# Patient Record
Sex: Male | Born: 1950 | Race: Black or African American | Hispanic: No | Marital: Single | Smoking: Never smoker
Health system: Southern US, Community
[De-identification: ages and names within clinical notes are randomized; demographics above are authoritative.]

## PROBLEM LIST (undated history)

## (undated) DIAGNOSIS — I10 Essential (primary) hypertension: Secondary | ICD-10-CM

## (undated) DIAGNOSIS — F1920 Other psychoactive substance dependence, uncomplicated: Secondary | ICD-10-CM

## (undated) DIAGNOSIS — M109 Gout, unspecified: Secondary | ICD-10-CM

## (undated) HISTORY — PX: BACK SURGERY: SHX140

---

## 2014-08-17 ENCOUNTER — Emergency Department (HOSPITAL_COMMUNITY): Payer: No Typology Code available for payment source

## 2014-08-17 ENCOUNTER — Emergency Department (HOSPITAL_COMMUNITY)
Admission: EM | Admit: 2014-08-17 | Discharge: 2014-08-17 | Disposition: A | Discharge status: Home / Self Care | Payer: No Typology Code available for payment source | Attending: Emergency Medicine | Admitting: Emergency Medicine

## 2014-08-17 ENCOUNTER — Encounter (HOSPITAL_COMMUNITY): Payer: Self-pay | Admitting: Emergency Medicine

## 2014-08-17 DIAGNOSIS — I10 Essential (primary) hypertension: Secondary | ICD-10-CM | POA: Diagnosis not present

## 2014-08-17 DIAGNOSIS — S161XXA Strain of muscle, fascia and tendon at neck level, initial encounter: Secondary | ICD-10-CM | POA: Diagnosis not present

## 2014-08-17 DIAGNOSIS — Y998 Other external cause status: Secondary | ICD-10-CM | POA: Insufficient documentation

## 2014-08-17 DIAGNOSIS — Z8739 Personal history of other diseases of the musculoskeletal system and connective tissue: Secondary | ICD-10-CM | POA: Diagnosis not present

## 2014-08-17 DIAGNOSIS — S3992XA Unspecified injury of lower back, initial encounter: Secondary | ICD-10-CM | POA: Insufficient documentation

## 2014-08-17 DIAGNOSIS — Y92410 Unspecified street and highway as the place of occurrence of the external cause: Secondary | ICD-10-CM | POA: Diagnosis not present

## 2014-08-17 DIAGNOSIS — Y9389 Activity, other specified: Secondary | ICD-10-CM | POA: Insufficient documentation

## 2014-08-17 DIAGNOSIS — S199XXA Unspecified injury of neck, initial encounter: Secondary | ICD-10-CM | POA: Diagnosis present

## 2014-08-17 HISTORY — DX: Gout, unspecified: M10.9

## 2014-08-17 HISTORY — DX: Essential (primary) hypertension: I10

## 2014-08-17 MED ORDER — OXYCODONE-ACETAMINOPHEN 5-325 MG PO TABS
1.0000 | ORAL_TABLET | Freq: Once | ORAL | Status: AC
  Administered 2014-08-17: 1 via ORAL
  Filled 2014-08-17: qty 1

## 2014-08-17 MED ORDER — OXYCODONE-ACETAMINOPHEN 5-325 MG PO TABS: ORAL_TABLET | ORAL | Status: DC

## 2014-08-17 NOTE — Discharge Instructions (Signed)
For pain control please take Ibuprofen (also known as Motrin or Advil) 400mg  (this is normally 2 over the counter pills) every 6 hours. Take with food to minimize stomach irritation.  Take percocet for breakthrough pain, do not drink alcohol, drive, care for children or do other critical tasks while taking percocet.  Do not hesitate to return to the emergency room for any new, worsening or concerning symptoms.  Please obtain primary care using resource guide below. But the minute you were seen in the emergency room and that they will need to obtain records for further outpatient management.   Cervical Sprain A cervical sprain is an injury in the neck in which the strong, fibrous tissues (ligaments) that connect your neck bones stretch or tear. Cervical sprains can range from mild to severe. Severe cervical sprains can cause the neck vertebrae to be unstable. This can lead to damage of the spinal cord and can result in serious nervous system problems. The amount of time it takes for a cervical sprain to get better depends on the cause and extent of the injury. Most cervical sprains heal in 1 to 3 weeks. CAUSES  Severe cervical sprains may be caused by:   Contact sport injuries (such as from football, rugby, wrestling, hockey, auto racing, gymnastics, diving, martial arts, or boxing).   Motor vehicle collisions.   Whiplash injuries. This is an injury from a sudden forward and backward whipping movement of the head and neck.  Falls.  Mild cervical sprains may be caused by:   Being in an awkward position, such as while cradling a telephone between your ear and shoulder.   Sitting in a chair that does not offer proper support.   Working at a poorly Marketing executivedesigned computer station.   Looking up or down for long periods of time.  SYMPTOMS   Pain, soreness, stiffness, or a burning sensation in the front, back, or sides of the neck. This discomfort may develop immediately after the injury or  slowly, 24 hours or more after the injury.   Pain or tenderness directly in the middle of the back of the neck.   Shoulder or upper back pain.   Limited ability to move the neck.   Headache.   Dizziness.   Weakness, numbness, or tingling in the hands or arms.   Muscle spasms.   Difficulty swallowing or chewing.   Tenderness and swelling of the neck.  DIAGNOSIS  Most of the time your health care provider can diagnose a cervical sprain by taking your history and doing a physical exam. Your health care provider will ask about previous neck injuries and any known neck problems, such as arthritis in the neck. X-rays may be taken to find out if there are any other problems, such as with the bones of the neck. Other tests, such as a CT scan or MRI, may also be needed.  TREATMENT  Treatment depends on the severity of the cervical sprain. Mild sprains can be treated with rest, keeping the neck in place (immobilization), and pain medicines. Severe cervical sprains are immediately immobilized. Further treatment is done to help with pain, muscle spasms, and other symptoms and may include:  Medicines, such as pain relievers, numbing medicines, or muscle relaxants.   Physical therapy. This may involve stretching exercises, strengthening exercises, and posture training. Exercises and improved posture can help stabilize the neck, strengthen muscles, and help stop symptoms from returning.  HOME CARE INSTRUCTIONS   Put ice on the injured area.  Put ice in a plastic bag.   Place a towel between your skin and the bag.   Leave the ice on for 15-20 minutes, 3-4 times a day.   If your injury was severe, you may have been given a cervical collar to wear. A cervical collar is a two-piece collar designed to keep your neck from moving while it heals.  Do not remove the collar unless instructed by your health care provider.  If you have long hair, keep it outside of the collar.  Ask  your health care provider before making any adjustments to your collar. Minor adjustments may be required over time to improve comfort and reduce pressure on your chin or on the back of your head.  Ifyou are allowed to remove the collar for cleaning or bathing, follow your health care provider's instructions on how to do so safely.  Keep your collar clean by wiping it with mild soap and water and drying it completely. If the collar you have been given includes removable pads, remove them every 1-2 days and hand wash them with soap and water. Allow them to air dry. They should be completely dry before you wear them in the collar.  If you are allowed to remove the collar for cleaning and bathing, wash and dry the skin of your neck. Check your skin for irritation or sores. If you see any, tell your health care provider.  Do not drive while wearing the collar.   Only take over-the-counter or prescription medicines for pain, discomfort, or fever as directed by your health care provider.   Keep all follow-up appointments as directed by your health care provider.   Keep all physical therapy appointments as directed by your health care provider.   Make any needed adjustments to your workstation to promote good posture.   Avoid positions and activities that make your symptoms worse.   Warm up and stretch before being active to help prevent problems.  SEEK MEDICAL CARE IF:   Your pain is not controlled with medicine.   You are unable to decrease your pain medicine over time as planned.   Your activity level is not improving as expected.  SEEK IMMEDIATE MEDICAL CARE IF:   You develop any bleeding.  You develop stomach upset.  You have signs of an allergic reaction to your medicine.   Your symptoms get worse.   You develop new, unexplained symptoms.   You have numbness, tingling, weakness, or paralysis in any part of your body.  MAKE SURE YOU:   Understand these  instructions.  Will watch your condition.  Will get help right away if you are not doing well or get worse. Document Released: 02/22/2007 Document Revised: 05/02/2013 Document Reviewed: 11/02/2012 [facility] Patient Information 2015 [place], [place]. This information is not intended to replace advice given to you by your health care provider. Make sure you discuss any questions you have with your health care provider.    Emergency Department Resource Guide 1) Find a Doctor and Pay Out of Pocket Although you won't have to find out who is covered by your insurance plan, it is a good idea to ask around and get recommendations. You will then need to call the office and see if the doctor you have chosen will accept you as a new patient and what types of options they offer for patients who are self-pay. Some doctors offer discounts or will set up payment plans for their patients who do not have insurance, but you  will need to ask so you aren't surprised when you get to your appointment.  2) Contact Your Local Health Department Not all health departments have doctors that can see patients for sick visits, but many do, so it is worth a call to see if yours does. If you don't know where your local health department is, you can check in your phone book. The CDC also has a tool to help you locate your state's health department, and many state websites also have listings of all of their local health departments.  3) Find a Walk-in Clinic If your illness is not likely to be very severe or complicated, you may want to try a walk in clinic. These are popping up all over the country in pharmacies, drugstores, and shopping centers. They're usually staffed by nurse practitioners or physician assistants that have been trained to treat common illnesses and complaints. They're usually fairly quick and inexpensive. However, if you have serious medical issues or chronic medical problems, these are probably not your best  option.  No Primary Care Doctor: - Call Health Connect at  [phone] - they can help you locate a primary care doctor that  accepts your insurance, provides certain services, etc. - Physician Referral Service- [phone]  Chronic Pain Problems: Organization         Address  Phone   Notes  [facility]  [phone] Patients need to be referred by their primary care doctor.   Medication Assistance: Organization         Address  Phone   Notes  [facility] Medication [facility] [address] [place] [place], [place] [address] [phone] --Must be a resident of [facility] -- Must have NO insurance coverage whatsoever (no Medicaid/ Medicare, etc.) -- The pt. MUST have a primary care doctor that directs their care regularly and follows them in the community   [facility]  [phone]   [employer]  [phone]    Agencies that provide inexpensive medical care: Organization         Address  Phone   Notes  [facility]  [phone]   [facility]    [phone]   [facility] [address] [place], [place] [address] [phone]   [facility] [address] [place] [place] [phone]   Planned Parenthood    [phone]   [facility]    [phone]   [facility]  [address], [place] Phone:  [phone], Fax:  [phone] Hours of Operation:  9 am - 6 pm, M-F.  Also accepts Medicaid/Medicare and self-pay.  [facility] for Children  [address], [place] Phone: [phone], Fax: [phone]. Hours of Operation:  8:30 am - 5:30 pm, M-F.  Also accepts Medicaid and self-pay.  [facility] [place] [address], [place] Phone: [phone]   [facility] [address] Natasha Bence [place], [place] [phone], Ext. [phone] Mondays & Thursdays: 7-9 AM.  First 15  patients are seen on a first come, first serve basis.    Medicaid-accepting [facility] Providers:  Organization         Address  Phone   Notes  [facility] [address],  [phone] Also accepts self-pay patients.  [facility] [address] [place], [place] [address],  [place]  [phone]   [facility] [address], [place] [phone]   [facility] [address], [place] [phone]   Renaye Rakers [address], [place]   [phone] Only accepts [place] Access [place] patients after they have their name applied to their card.   Self-Pay (no insurance) in [facility]:  Organization         Address  Phone   Notes  Sickle Cell Patients, [facility] Internal Medicine [address] [place], [place] [phone]   [facility] [address] [place], [place] [phone]   [facility]  [address], [place] [phone]   [facility]/Dr. Osei-Bonsu  [address], [place] or [address], [place] [phone] Phone number for both [place] and [place] locations is the same.  Urgent Medical and [facility] [address] [place] [phone]   [facility] [address] [place] or [address] [phone]   [facility] [address] [place] [phone], phone; [phone], fax Sees patients 1st and 3rd Saturday of every month.  Must not qualify for public or private insurance (i.e. Medicaid, Medicare, [place] Health Choice, Veterans' Benefits)  Household income should be no more than 200% of the poverty level The clinic cannot treat you if you are pregnant or think you are pregnant  Sexually transmitted diseases are not treated at the clinic.    Dental  Care: Organization         Address  Phone  Notes  [facility] Department of [facility] [address] [place], [place] [phone] Accepts children up to age 26 who are enrolled in [place] or [place] Health Choice; pregnant women with a Medicaid card; and children who have applied for Medicaid or [place] Health Choice, but were declined, whose parents can pay a reduced fee at time of service.  [facility] of [facility]  [address], [place] [phone] Accepts children up to age 41 who are enrolled in [place] or [place] Health Choice; pregnant women with a Medicaid card; and children who have applied for Medicaid or [place] Health Choice, but were declined, whose parents can pay a reduced fee at time of service.  [facility]  [address] [place], [place] [phone] Patients are seen by appointment only. Walk-ins are not accepted. [facility] will see patients 9 years of age and older. Monday - Tuesday (8am-5pm) Most Wednesdays (8:30-5pm) $30 per visit, cash only  [facility]  [address], [facility] [phone] Patients are seen by appointment only. Walk-ins are not accepted. [facility] will see patients 62 years of age and older. One Wednesday Evening (Monthly: Volunteer Based).  $30 per visit, cash only  [employer]  [phone] for adults; Children under age 50, call Graduate Pediatric Dentistry at [phone]. Children aged 41-14, please call [phone] to request a pediatric application.  Dental services are provided in all areas of dental care including fillings, crowns and bridges, complete and partial dentures, implants, gum treatment, root canals, and extractions. Preventive care is also provided. Treatment is provided to both adults and children. Patients are selected via a lottery and there  is often a waiting list.   [facility] [address], [place]  [phone] [email or web address]   [facility] [address] [place], [place] [phone], Ext. [phone] Second and Fourth Thursday of each month, opens at 6:30 AM; Clinic ends at 9 AM.  Patients are seen on a first-come first-served basis, and a limited number are seen during each clinic.   [facility]  [address] Ether [place], [place] [phone]   Eligibility Requirements You must have lived in [place], [place], or [place] counties for at least the last three months.   You cannot be eligible for state or federal sponsored [place], including [employer], [place], or [employer].   You generally cannot be eligible for healthcare insurance through your employer.    How to apply: Eligibility screenings are held every Tuesday and Wednesday afternoon from 1:00 pm until 4:00 pm. You do not need an appointment for the interview!  [facility] [address] [place], [place] [phone]   [facility] Health Department  [phone]   [facility] Health Department  [phone]   [facility] Health Department  [phone]    Behavioral Health Resources in the Community: Intensive Outpatient Programs Organization         Address  Phone  Notes  [facility] [address], [place], [place] [phone]   [facility] Outpatient [address], [place], [place] [phone]   [facility] [address] [place], [place]  [phone]   [facility] Mental Health [address]  [place], [place] [phone] or [phone]   Substance Abuse Resources Organization         Address  Phone  Notes  [facility]  [phone]   [facility]  [phone]   [facility] [place]  [phone]   Floydene Flock  [phone]   Residential & Outpatient Substance Abuse Program  [phone]    Psychological Services Organization         Address  Phone  Notes  [facility]  [phone]   [facility]  [phone]   [facility] Mental Health [address], [place] [phone] or [phone]    Mobile Crisis Teams Organization         Address  Phone  Notes  [facility], Mobile Crisis Care Unit  [phone]   [facility]  [address] [place], [place] [phone]   Doristine Locks [address] [place] [phone]    Self-Help/Support Groups Organization         Address  Phone             Notes  [facility] [place] - variety of support groups  336- I7437963 Call for more information  Narcotics Anonymous (NA), [facility] [address], [employer] [place]  2 meetings at this location   Statistician         Address  Phone  Notes  [facility] [address],    [place]  [phone]   [facility]  [address], [place] [address], [place], [place] [phone]   [facility] Treatment [facility] [address] [place], [place] [phone] Admissions: 8am-3pm M-F  [facility] [address],    [place], [place] [phone]   [facility] [address] [place], [place], [place] [phone]  [facility] [address]  [place], [place] [phone]   [facility] - Intensive Outpatient [address] Laurell [address], [place], [place] [phone]   [facility] ([facility] [address] [place]  [place], [place] [phone] or [phone]   [facility] ([facility]) [address], [place], [place] [phone] Accepts Medicaid  [facility] [address],  [place] [phone] Substance Abuse/Addiction Treatment   [facility] Organization         Address  Phone  Notes  [facility]  [phone]   Angie Fava, PhD [address] Ervin Knack [place], [place]   [phone] or [phone]   [facility]   [address] [place], [place] [phone]   [facility] [address], [place], [place] [phone] Insurance/Medicaid/sponsorship through [facility] [address]                                    [place], [place] [phone] Therapy/tele-psych/case  [facility] [address], [place] [phone]    Dr. Lolly Mustache  [phone]   [facility]  [employer] [facility] Dept. 1) [address] [place] 2) [address] [place] 3)  [address], [place] [phone]  [phone]   [facility] Child Abuse Hotline 787-099-8874 or [phone] (After Hours)

## 2014-08-17 NOTE — ED Notes (Signed)
Pt taken to CT.
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
## 2014-09-02 ENCOUNTER — Emergency Department (HOSPITAL_COMMUNITY)
Admission: EM | Admit: 2014-09-02 | Discharge: 2014-09-02 | Disposition: A | Discharge status: Home / Self Care | Payer: No Typology Code available for payment source | Attending: Emergency Medicine | Admitting: Emergency Medicine

## 2014-09-02 ENCOUNTER — Encounter (HOSPITAL_COMMUNITY): Payer: Self-pay

## 2014-09-02 DIAGNOSIS — Y9389 Activity, other specified: Secondary | ICD-10-CM | POA: Diagnosis not present

## 2014-09-02 DIAGNOSIS — Y92410 Unspecified street and highway as the place of occurrence of the external cause: Secondary | ICD-10-CM | POA: Insufficient documentation

## 2014-09-02 DIAGNOSIS — M545 Low back pain, unspecified: Secondary | ICD-10-CM

## 2014-09-02 DIAGNOSIS — S3992XA Unspecified injury of lower back, initial encounter: Secondary | ICD-10-CM | POA: Diagnosis not present

## 2014-09-02 DIAGNOSIS — Z8739 Personal history of other diseases of the musculoskeletal system and connective tissue: Secondary | ICD-10-CM | POA: Insufficient documentation

## 2014-09-02 DIAGNOSIS — Y998 Other external cause status: Secondary | ICD-10-CM | POA: Insufficient documentation

## 2014-09-02 DIAGNOSIS — I10 Essential (primary) hypertension: Secondary | ICD-10-CM | POA: Insufficient documentation

## 2014-09-02 MED ORDER — METHOCARBAMOL 500 MG PO TABS: 500.0000 mg | ORAL_TABLET | Freq: Two times a day (BID) | ORAL | Status: DC | PRN

## 2014-09-02 MED ORDER — ACETAMINOPHEN 500 MG PO TABS: 500.0000 mg | ORAL_TABLET | Freq: Four times a day (QID) | ORAL | Status: DC | PRN

## 2014-09-02 NOTE — ED Notes (Signed)
PA at bedside.
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
## 2014-09-02 NOTE — Discharge Instructions (Signed)
Back Pain, Adult °Low back pain is very common. About 1 in 5 people have back pain. The cause of low back pain is rarely dangerous. The pain often gets better over time. About half of people with a sudden onset of back pain feel better in just 2 weeks. About 8 in 10 people feel better by 6 weeks.  °CAUSES °Some common causes of back pain include: °· Strain of the muscles or ligaments supporting the spine. °· Wear and tear (degeneration) of the spinal discs. °· Arthritis. °· Direct injury to the back. °DIAGNOSIS °Most of the time, the direct cause of low back pain is not known. However, back pain can be treated effectively even when the exact cause of the pain is unknown. Answering your caregiver's questions about your overall health and symptoms is one of the most accurate ways to make sure the cause of your pain is not dangerous. If your caregiver needs more information, he or she may order lab work or imaging tests (X-rays or MRIs). However, even if imaging tests show changes in your back, this usually does not require surgery. °HOME CARE INSTRUCTIONS °For many people, back pain returns. Since low back pain is rarely dangerous, it is often a condition that people can learn to manage on their own.  °· Remain active. It is stressful on the back to sit or stand in one place. Do not sit, drive, or stand in one place for more than 30 minutes at a time. Take short walks on level surfaces as soon as pain allows. Try to increase the length of time you walk each day. °· Do not stay in bed. Resting more than 1 or 2 days can delay your recovery. °· Do not avoid exercise or work. Your body is made to move. It is not dangerous to be active, even though your back may hurt. Your back will likely heal faster if you return to being active before your pain is gone. °· Pay attention to your body when you  bend and lift. Many people have less discomfort when lifting if they bend their knees, keep the load close to their bodies, and  avoid twisting. Often, the most comfortable positions are those that put less stress on your recovering back. °· Find a comfortable position to sleep. Use a firm mattress and lie on your side with your knees slightly bent. If you lie on your back, put a pillow under your knees. °· Only take over-the-counter or prescription medicines as directed by your caregiver. Over-the-counter medicines to reduce pain and inflammation are often the most helpful. Your caregiver may prescribe muscle relaxant drugs. These medicines help dull your pain so you can more quickly return to your normal activities and healthy exercise. °· Put ice on the injured area. °· Put ice in a plastic bag. °· Place a towel between your skin and the bag. °· Leave the ice on for 15-20 minutes, 03-04 times a day for the first 2 to 3 days. After that, ice and heat may be alternated to reduce pain and spasms. °· Ask your caregiver about trying back exercises and gentle massage. This may be of some benefit. °· Avoid feeling anxious or stressed. Stress increases muscle tension and can worsen back pain. It is important to recognize when you are anxious or stressed and learn ways to manage it. Exercise is a great option. °SEEK MEDICAL CARE IF: °· You have pain that is not relieved with rest or medicine. °· You have pain that does not improve in 1 week. °· You have new symptoms. °· You are generally not feeling well. °SEEK   IMMEDIATE MEDICAL CARE IF:  °· You have pain that radiates from your back into your legs. °· You develop new bowel or bladder control problems. °· You have unusual weakness or numbness in your arms or legs. °· You develop nausea or vomiting. °· You develop abdominal pain. °· You feel faint. °Document Released: 04/27/2005 Document Revised: 10/27/2011 Document Reviewed: 08/29/2013 °Exit[employer]® Patient Information ©2015 [employer]. This information is not intended to replace advice given to you by your health care provider. Make sure you  discuss any questions you have with your health care provider. ° °Back Exercises °Back exercises help treat and prevent back injuries. The goal of back exercises is to increase the strength of your abdominal and back muscles and the flexibility of your back. These exercises should be started when you no longer have back pain. Back exercises include: °· Pelvic Tilt. Lie on your back with your knees bent. Tilt your pelvis until the lower part of your back is against the floor. Hold this position 5 to 10 sec and repeat 5 to 10 times. °· Knee to Chest. Pull first 1 knee up against your chest and hold for 20 to 30 seconds, repeat this with the other knee, and then both knees. This may be done with the other leg straight or bent, whichever feels better. °· Sit-Ups or Curl-Ups. Bend your knees 90 degrees. Start with tilting your pelvis, and do a partial, slow sit-up, lifting your trunk only 30 to 45 degrees off the floor. Take at least 2 to 3 seconds for each sit-up. Do not do sit-ups with your knees out straight. If partial sit-ups are difficult, simply do the above but with only tightening your abdominal muscles and holding it as directed. °· Hip-Lift. Lie on your back with your knees flexed 90 degrees. Push down with your feet and shoulders as you raise your hips a couple inches off the floor; hold for 10 seconds, repeat 5 to 10 times. °· Back arches. Lie on your stomach, propping yourself up on bent elbows. Slowly press on your hands, causing an arch in your low back. Repeat 3 to 5 times. Any initial stiffness and discomfort should lessen with repetition over time. °· Shoulder-Lifts. Lie face down with arms beside your body. Keep hips and torso pressed to floor as you slowly lift your head and shoulders off the floor. °Do not overdo your exercises, especially in the beginning. Exercises may cause you some mild back discomfort which lasts for a few minutes; however, if the pain is more severe, or lasts for more than 15  minutes, do not continue exercises until you see your caregiver. Improvement with exercise therapy for back problems is slow.  °See your caregivers for assistance with developing a proper back exercise program. °Document Released: 06/04/2004 Document Revised: 07/20/2011 Document Reviewed: 02/26/2011 °ExitCare® Patient Information ©2015 [employer]. This information is not intended to replace advice given to you by your health care provider. Make sure you discuss any questions you have with your health care provider. °Motor Vehicle Collision °It is common to have multiple bruises and sore muscles after a motor vehicle collision (MVC). These tend to feel worse for the first 24 hours. You may have the most stiffness and soreness over the first several hours. You may also feel worse when you wake up the first morning after your collision. After this point, you will usually begin to improve with each day. The speed of improvement often depends on the severity of the collision,   the number of injuries, and the location and nature of these injuries. °HOME CARE INSTRUCTIONS °· Put ice on the injured area. °¨ Put ice in a plastic bag. °¨ Place a towel between your skin and the bag. °¨ Leave the ice on for 15-20 minutes, 3-4 times a day, or as directed by your health care provider. °· Drink enough fluids to keep your urine clear or pale yellow. Do not drink alcohol. °· Take a warm shower or bath once or twice a day. This will increase blood flow to sore muscles. °· You may return to activities as directed by your caregiver. Be careful when lifting, as this may aggravate neck or back pain. °· Only take over-the-counter or prescription medicines for pain, discomfort, or fever as directed by your caregiver. Do not use aspirin. This may increase bruising and bleeding. °SEEK IMMEDIATE MEDICAL CARE IF: °· You have numbness, tingling, or weakness in the arms or legs. °· You develop severe headaches not relieved with  medicine. °· You have severe neck pain, especially tenderness in the middle of the back of your neck. °· You have changes in bowel or bladder control. °· There is increasing pain in any area of the body. °· You have shortness of breath, light-headedness, dizziness, or fainting. °· You have chest pain. °· You feel sick to your stomach (nauseous), throw up (vomit), or sweat. °· You have increasing abdominal discomfort. °· There is blood in your urine, stool, or vomit. °· You have pain in your shoulder (shoulder strap areas). °· You feel your symptoms are getting worse. °MAKE SURE YOU: °· Understand these instructions. °· Will watch your condition. °· Will get help right away if you are not doing well or get worse. °Document Released: 04/27/2005 Document Revised: 09/11/2013 Document Reviewed: 09/24/2010 °ExitCare® Patient Information ©2015 [employer]. This information is not intended to replace advice given to you by your health care provider. Make sure you discuss any questions you have with your health care provider. ° °

## 2015-12-31 ENCOUNTER — Encounter (HOSPITAL_COMMUNITY): Payer: Self-pay | Admitting: *Deleted

## 2015-12-31 ENCOUNTER — Inpatient Hospital Stay (HOSPITAL_COMMUNITY)
Admission: AD | Admit: 2015-12-31 | Discharge: 2016-01-06 | DRG: 885 | Disposition: A | Discharge status: Home / Self Care | Payer: Medicare Other | Source: Intra-hospital | Attending: Psychiatry | Admitting: Psychiatry

## 2015-12-31 ENCOUNTER — Emergency Department (HOSPITAL_COMMUNITY)
Admission: EM | Admit: 2015-12-31 | Discharge: 2015-12-31 | Disposition: A | Discharge status: Other Acute Inpatient Hospital | Payer: Medicare Other | Attending: Emergency Medicine | Admitting: Emergency Medicine

## 2015-12-31 DIAGNOSIS — M109 Gout, unspecified: Secondary | ICD-10-CM | POA: Diagnosis present

## 2015-12-31 DIAGNOSIS — G4700 Insomnia, unspecified: Secondary | ICD-10-CM | POA: Diagnosis present

## 2015-12-31 DIAGNOSIS — I10 Essential (primary) hypertension: Secondary | ICD-10-CM | POA: Diagnosis present

## 2015-12-31 DIAGNOSIS — Z79891 Long term (current) use of opiate analgesic: Secondary | ICD-10-CM | POA: Diagnosis not present

## 2015-12-31 DIAGNOSIS — F329 Major depressive disorder, single episode, unspecified: Secondary | ICD-10-CM | POA: Diagnosis not present

## 2015-12-31 DIAGNOSIS — R45851 Suicidal ideations: Secondary | ICD-10-CM | POA: Diagnosis present

## 2015-12-31 DIAGNOSIS — F333 Major depressive disorder, recurrent, severe with psychotic symptoms: Secondary | ICD-10-CM | POA: Diagnosis present

## 2015-12-31 DIAGNOSIS — Z79899 Other long term (current) drug therapy: Secondary | ICD-10-CM | POA: Diagnosis not present

## 2015-12-31 DIAGNOSIS — F4310 Post-traumatic stress disorder, unspecified: Secondary | ICD-10-CM | POA: Diagnosis not present

## 2015-12-31 DIAGNOSIS — F4312 Post-traumatic stress disorder, chronic: Secondary | ICD-10-CM | POA: Diagnosis present

## 2015-12-31 DIAGNOSIS — G8929 Other chronic pain: Secondary | ICD-10-CM | POA: Diagnosis present

## 2015-12-31 DIAGNOSIS — Z7982 Long term (current) use of aspirin: Secondary | ICD-10-CM | POA: Diagnosis not present

## 2015-12-31 DIAGNOSIS — F332 Major depressive disorder, recurrent severe without psychotic features: Principal | ICD-10-CM | POA: Diagnosis present

## 2015-12-31 DIAGNOSIS — M549 Dorsalgia, unspecified: Secondary | ICD-10-CM | POA: Diagnosis present

## 2015-12-31 LAB — CBC
HCT: 44.3 % (ref 39.0–52.0)
HEMOGLOBIN: 14.6 g/dL (ref 13.0–17.0)
MCH: 28.5 pg (ref 26.0–34.0)
MCHC: 33 g/dL (ref 30.0–36.0)
MCV: 86.4 fL (ref 78.0–100.0)
Platelets: 329 10*3/uL (ref 150–400)
RBC: 5.13 MIL/uL (ref 4.22–5.81)
RDW: 13.9 % (ref 11.5–15.5)
WBC: 5.7 10*3/uL (ref 4.0–10.5)

## 2015-12-31 LAB — COMPREHENSIVE METABOLIC PANEL
ALBUMIN: 4.2 g/dL (ref 3.5–5.0)
ALK PHOS: 77 U/L (ref 38–126)
ALT: 19 U/L (ref 17–63)
ANION GAP: 8 (ref 5–15)
AST: 17 U/L (ref 15–41)
BUN: 14 mg/dL (ref 6–20)
CHLORIDE: 106 mmol/L (ref 101–111)
CO2: 24 mmol/L (ref 22–32)
Calcium: 9.3 mg/dL (ref 8.9–10.3)
Creatinine, Ser: 1.08 mg/dL (ref 0.61–1.24)
GFR calc Af Amer: >60 mL/min (ref >60)
GFR calc non Af Amer: >60 mL/min (ref >60)
GLUCOSE: 81 mg/dL (ref 65–99)
POTASSIUM: 3.7 mmol/L (ref 3.5–5.1)
Sodium: 138 mmol/L (ref 135–145)
Total Bilirubin: 0.7 mg/dL (ref 0.3–1.2)
Total Protein: 7.8 g/dL (ref 6.5–8.1)

## 2015-12-31 LAB — RAPID URINE DRUG SCREEN, HOSP PERFORMED
AMPHETAMINES: NOT DETECTED
BENZODIAZEPINES: NOT DETECTED
Barbiturates: NOT DETECTED
Cocaine: POSITIVE — AB
Opiates: POSITIVE — AB
Tetrahydrocannabinol: POSITIVE — AB

## 2015-12-31 LAB — SALICYLATE LEVEL: Salicylate Lvl: <4 mg/dL (ref 2.8–30.0)

## 2015-12-31 LAB — ACETAMINOPHEN LEVEL

## 2015-12-31 LAB — ETHANOL: Alcohol, Ethyl (B): <5 mg/dL (ref <5)

## 2015-12-31 MED ORDER — LORAZEPAM 1 MG PO TABS
1.0000 mg | ORAL_TABLET | Freq: Three times a day (TID) | ORAL | Status: DC | PRN
  Administered 2015-12-31: 1 mg via ORAL
  Filled 2015-12-31: qty 1

## 2015-12-31 MED ORDER — MAGNESIUM HYDROXIDE 400 MG/5ML PO SUSP: 30.0000 mL | Freq: Every day | ORAL | Status: DC | PRN

## 2015-12-31 MED ORDER — ACETAMINOPHEN 325 MG PO TABS
650.0000 mg | ORAL_TABLET | ORAL | Status: DC | PRN
  Administered 2015-12-31: 650 mg via ORAL
  Filled 2015-12-31: qty 2

## 2015-12-31 MED ORDER — TRAZODONE HCL 50 MG PO TABS
50.0000 mg | ORAL_TABLET | Freq: Every evening | ORAL | Status: DC | PRN
  Administered 2015-12-31 – 2016-01-01 (×2): 50 mg via ORAL
  Filled 2015-12-31 (×9): qty 1

## 2015-12-31 MED ORDER — HYDROXYZINE HCL 25 MG PO TABS
25.0000 mg | ORAL_TABLET | Freq: Four times a day (QID) | ORAL | Status: DC | PRN
  Administered 2015-12-31 – 2016-01-05 (×6): 25 mg via ORAL
  Filled 2015-12-31 (×6): qty 1

## 2015-12-31 MED ORDER — ALUM & MAG HYDROXIDE-SIMETH 200-200-20 MG/5ML PO SUSP: 30.0000 mL | ORAL | Status: DC | PRN

## 2015-12-31 MED ORDER — ACETAMINOPHEN 325 MG PO TABS
650.0000 mg | ORAL_TABLET | ORAL | Status: DC | PRN
  Administered 2015-12-31 – 2016-01-01 (×2): 650 mg via ORAL
  Filled 2015-12-31 (×2): qty 2

## 2015-12-31 MED ORDER — IBUPROFEN 600 MG PO TABS: 600.0000 mg | ORAL_TABLET | Freq: Four times a day (QID) | ORAL | Status: DC | PRN

## 2015-12-31 NOTE — ED Notes (Signed)
Pt rewanded by Security.  
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
## 2015-12-31 NOTE — Progress Notes (Signed)
Pt states [facility] [place] [facility]

## 2015-12-31 NOTE — ED Notes (Signed)
Pt discharged ambulatory with [place] driver.  All belongings were sent with patient. 

## 2015-12-31 NOTE — BH Assessment (Signed)
[facility] Assessment Progress Note  Per Nanine MeansJamison Lord, DNP, this pt requires psychiatric hospitalization at this time.  Berneice Heinrichina Tate, RN, [facility] to Rm 403[facility]1.  Pt has signed Voluntary Admission and Consent for Treatment, as well as Consent to [facility] Information [facility], and signed [facility].  Pt's nurse, Kendal Hymendie, has been notified, and agrees to send original paperwork along with pt via [facility], and to call report to [phone].  Doylene Canninghomas Garlon Tuggle, MA Triage Specialist [phone]

## 2015-12-31 NOTE — BH Assessment (Addendum)
Assessment Note  Devon MeierOscar L Harris is an 65 y.o. male [facility]. Patient sts that he called [employer] and told them that he wanted to end his life.  [employer] arrived and called EMS. Patient was found sitting in his car, windows up, and engine off. Patient told [employer] and EMS upon arrival that he wanted them to take him [facility] health treatment. Patient stating that he is suicidal. Onset of suicidal thoughts is unknown as patient does not answer this questions. Patient asked about suicidal triggers and responds, "I don't know.Marland Kitchen.Marland Kitchen.I'm just a piece of shit and I want to die". Patient did not respond when asked about previous history of suicidal thoughts or gestures or self mutilating behaviors. Patient did not respond when asked about depressive symptoms. He also did not respond when asked about sleep and/or appetite. Patient made very little eye contact, remained unresponsive throughout the TTS assessment, and appeared withdrawn. Patient denied HI. Patient asked about history of aggressive or assaultive behaviors. He stated, "No and I don't want to talk about that". Denied AVH's. Patient does not appear to responding to internal stimuli. He sts that [facility] has [facility] but did not provide any details as the reason or when he was admitted. Patient admits that he has seen the outpatient psychiatrist/therapist [facility] in the past. He denies alcohol and drug use.    Diagnosis: Major Depressive Disorder, Recurrent, Severe, without psychotic features  Past Medical History:  Past Medical History:  Diagnosis Date  . Gout   . Hypertension     Past Surgical History:  Procedure Laterality Date  . BACK SURGERY      Family History: No family history on file.  Social History:  reports that he has never smoked. He has never used smokeless tobacco. He reports that he drinks alcohol. He reports that he does not use drugs.  Additional Social History:   Alcohol / Drug Use Pain Medications: SEE MAR Prescriptions: SEE MAR Over the Counter: SEE MAR History of alcohol / drug use?: No history of alcohol / drug abuse  CIWA: CIWA-Ar BP: 131/90 Pulse Rate: 83 COWS:    Allergies: No Known Allergies  Home Medications:  (Not in a hospital admission)  OB/GYN Status:  No LMP for male patient.  General Assessment Data [facility]: [facility] TTS Assessment: In system Is this a Tele or Face-to-Face Assessment?: Face-to-Face Is this an Initial Assessment or a Re-assessment for this encounter?: Initial Assessment Marital status: Single Maiden name:  (n/a) Is patient pregnant?: No Pregnancy Status: No Living Arrangements: Alone (recently moved from [place] ) Can pt return to current living arrangement?: No Admission Status: Voluntary Is patient capable of signing voluntary admission?: Yes Referral Source: Self/Family/Friend Insurance type:  Actor(Medicare and Location managerTricare)  Medical Screening Exam [facility] Walk[facility]in ONLY) Medical Exam completed: No  Crisis Care Plan Living Arrangements: Alone ([facility] [facility] ) Legal Guardian: Other: (no legal guardian ) [facility] Psychiatrist:  [facility] [place] Name of Therapist:  ([facility] [place] )  Education Status Is patient currently in school?: No Current Grade:  (n/a) Highest grade of school patient has completed:  (n/a) Name of school:  (unk)  Risk to self with the past 6 months Suicidal Ideation: Yes-Currently Present Has patient been a risk to self within the past 6 months prior to admission? : Yes Suicidal Intent: Yes-Currently Present Has patient had any  suicidal intent within the past 6 months prior to admission? : Yes Is patient at risk for suicide?: Yes Suicidal Plan?: No Has patient had any suicidal plan within the past 6 months prior to admission? : No Access to Means: No What has been your use of drugs/alcohol within the last 12 months?:   (n/a) Previous Attempts/Gestures: No How many times?:  (0) Other Self Harm Risks:  (denies self harm ) Triggers for Past Attempts: Other (Comment) (denies prevoious attempts or gestures ) Intentional Self Injurious Behavior: None Family Suicide History: No Recent stressful life event(s): Other (Comment) (patient sts, "Life") Persecutory voices/beliefs?: No Depression: Yes Depression Symptoms: Feeling angry/irritable, Loss of interest in usual pleasures, Feeling worthless/self pity, Fatigue, Guilt, Isolating, Tearfulness, Insomnia, Despondent Substance abuse history and/or treatment for substance abuse?: No Suicide prevention information given to non-admitted patients: Not applicable  Risk to Others within the past 6 months Homicidal Ideation: No Does patient have any lifetime risk of violence toward others beyond the six months prior to admission? : No Thoughts of Harm to Others: No Current Homicidal Intent: No Current Homicidal Plan: No Access to Homicidal Means: No Identified Victim:  (n/a) History of harm to others?: No Assessment of Violence: None Noted Violent Behavior Description:  (calm ) Does patient have access to weapons?: No Criminal Charges Pending?: No Does patient have a court date: No Is patient on probation?: No  Psychosis Hallucinations: None noted Delusions: None noted  Mental Status Report Appearance/Hygiene: In scrubs Eye Contact: Poor Motor Activity: Unremarkable Speech: Soft, Slow Level of Consciousness: Alert, Restless Mood: Depressed, Anxious, Apprehensive, Irritable, Preoccupied Affect: Apprehensive, Irritable, Preoccupied Anxiety Level: Minimal Thought Processes: Circumstantial Judgement: Impaired Orientation: Person, Place, Time, Situation Obsessive Compulsive Thoughts/Behaviors: None  Cognitive Functioning Concentration: Decreased Memory: Recent Intact, Remote Intact IQ: Average Insight: Poor Impulse Control: Poor Appetite:  Poor Weight Loss:  (none reported) Weight Gain:  (no weight gain ) Sleep: Decreased (limited ) Total Hours of Sleep:  (varies (pt did not disclose hours of sleep)) Vegetative Symptoms: None  ADLScreening [facility] Assessment Services) Patient's cognitive ability adequate to safely complete daily activities?: No Patient able to express need for assistance with ADLs?: Yes Independently performs ADLs?: Yes (appropriate for developmental age)  Prior Inpatient Therapy Prior Inpatient Therapy: Yes Prior Therapy Dates:  (past (patient unable to recall dates)) Prior Therapy Facilty/Provider(s):  ([facility] ) Reason for Treatment:  (patient did not disclose)  Prior Outpatient Therapy Prior Outpatient Therapy: Yes Prior Therapy Dates:  (present and past) Prior Therapy Facilty/Provider(s):  ([facility] ) Reason for Treatment:  (depression ) Does patient have an ACCT team?: No Does patient have Intensive In-House Services?  : No Does patient have [facility] services? : No Does patient have P4CC services?: No  ADL Screening (condition at time of admission) Patient's cognitive ability adequate to safely complete daily activities?: No Is the patient deaf or have difficulty hearing?: No Does the patient have difficulty seeing, even when wearing glasses/contacts?: Yes Does the patient have difficulty concentrating, remembering, or making decisions?: Yes Patient able to express need for assistance with ADLs?: Yes Does the patient have difficulty dressing or bathing?: No Independently performs ADLs?: Yes (appropriate for developmental age) Does the patient have difficulty walking or climbing stairs?: No Weakness of Legs: None Weakness of Arms/Hands: None       Abuse/Neglect Assessment (Assessment to be complete while patient is alone) Physical Abuse: Denies Verbal Abuse: Denies Sexual Abuse: Denies Exploitation of patient/patient's resources: Denies Self-Neglect:  Denies  Values / Beliefs Cultural Requests During Hospitalization: None Spiritual Requests During Hospitalization: None   Advance Directives (For Healthcare) Does patient have an advance directive?: No Would patient like information on creating an advanced directive?: No - patient declined information Nutrition Screen- [facility] Patient's home diet: Regular  Additional Information 1:1 In Past 12 Months?: No CIRT Risk: No Elopement Risk: No Does patient have medical clearance?: Yes     Disposition:  Disposition Initial Assessment Completed for this Encounter: Yes Disposition of Patient: Inpatient treatment program (Patient meets criteria for INPT treatment per Nanine MeansJamison Lord, ) Type of inpatient treatment program: Adult  On Site Evaluation by:   Reviewed with Physician:   Nanine MeansJamison Lord, DNP  Melynda RipplePerry, [facility] 12/31/2015 2:23 PM

## 2015-12-31 NOTE — ED Notes (Signed)
Pt oriented to room and unit.  He is withdrawn and uncommunicative.  He states " I just need to be gone"  He was offered a snack or drink or remote which he had no interest in any of.  15 minute checks and video monitoring continue.

## 2015-12-31 NOTE — ED Triage Notes (Signed)
Per EMS, here for SI. Pt states for past year and a half had "issues and has been down on his luck". [facility]. Pt was sitting in his car with windows up and engine off upon EMS arrival. Pt states "everything would be better if he was dead". Pt denies having plan.

## 2015-12-31 NOTE — Progress Notes (Signed)
Patient ID: Devon MeierOscar L Broshears, male   DOB: [birth year], 65 y.o.   MRN: [id number] Per State regulations 482.30 this chart was reviewed for medical necessity with respect to the patient's admission/duration of stay.    Next review date: 01/03/16  Thurman CoyerEric Jigar Zielke, BSN, RN-BC  Case Manager

## 2015-12-31 NOTE — ED Notes (Signed)
[facility] Expected date:  Expected time:  Means of arrival:  Comments: Triage 3

## 2015-12-31 NOTE — Progress Notes (Signed)
12/31/15 1501:  Pt is a new admit.  LRT introduced self to pt and offered activities.  Pt was depressed and stated he was not interested in activities.  Caroll RancherMarjette Mykelle Cockerell, LRT/CTRS

## 2015-12-31 NOTE — Progress Notes (Signed)
Admission Note:  65 year old male who presents voluntary, in no acute distress, for the treatment of SI, Depression, and Substance Abuse. Patient appears depressed with flat affect. Brightens on approach.  Patient was calm and cooperative with admission process. Patient presents with passive SI and contracts for safety upon admission. Patient positive for Auditory Hallucinations.  Patient reports SI prior to admission with "whole lot of plans" with one plan being "pretend to rob a bank and have police kill me".  Patient states "I want to die like I'm in combat".  Patient verbalizes feelings of hopelessness stating "I didn't feel the need to be around anymore. I feel empty, useless, unimportant".  Patient states "I got a sickness in my head".  Patient reports being retired from [employer], Patient lives alone and [place], [place] to [place].  Patient is unable to identify a support system. Patient reports prior suicide attempt "2-3 years ago" and states "I took a lot of medication".  Patient reports feelings/voices to hurt people stating "I've been thinking about hurting someone. I think it'll feel good. I started to but I backed off".  Patient reports hx of substance abuse.  Patient reports hx of marijuana, cocaine, heroin, and alcohol use.  Patient reports last use "yesterday. I took all of them, marijuana, cocaine, heroin, and alcohol".  Patient denies any significant medical hx aside from chronic back pain.  Patient reports "string of deaths in my family" as stressor.  Skin was assessed and found to be clear of any abnormal marks.  Patient searched and no contraband found, POC and unit policies explained and understanding verbalized. Consents obtained. Food and fluids offered and accepted. Patient had no additional questions or concerns.

## 2015-12-31 NOTE — Tx Team (Signed)
Initial Treatment Plan 12/31/2015 7:23 PM Devon MeierOscar L Harris [id number]    PATIENT STRESSORS: Health problems Substance abuse   PATIENT STRENGTHS: Ability for insight Active sense of humor Average or above average intelligence Capable of independent living Communication skills Financial means   PATIENT IDENTIFIED PROBLEMS: At risk for suicide  Depression  Substance Abuse  "Get rid of the sickness in my head"  "Feeling empty, useless, and unimportant"             DISCHARGE CRITERIA:  Ability to meet basic life and health needs Improved stabilization in mood, thinking, and/or behavior Motivation to continue treatment in a less acute level of care Need for constant or close observation no longer present Safe-care adequate arrangements made Withdrawal symptoms are absent or subacute and managed without 24-hour nursing intervention  PRELIMINARY DISCHARGE PLAN: Attend 12-step recovery group Outpatient therapy Return to previous living arrangement  PATIENT/FAMILY INVOLVEMENT: This treatment plan has been presented to and reviewed with the patient, Devon Harris.  The patient and family have been given the opportunity to ask questions and make suggestions.  Carleene OverlieMiddleton, Nashiya Disbrow P, RN 12/31/2015, 7:23 PM

## 2015-12-31 NOTE — Progress Notes (Signed)
Patient ID: Devon MeierOscar L Zellers, male   DOB: Nov 24, 1950, 65 y.o.   MRN: [id number] D: Client complains of back pain "8" of 10, noted history of back surgery. Client also reports "I got some serious issues"  "can't think like I use to" "things ain"t right in the head" client reports he was taking his medications "off and on" Client reports [employer] veteran of 26 years. "[facility]" . Writer provided emotional support, administered medications. Staff will monitor q1315min for safety. R: Client is safe on the unit.

## 2016-01-01 DIAGNOSIS — R45851 Suicidal ideations: Secondary | ICD-10-CM

## 2016-01-01 DIAGNOSIS — F333 Major depressive disorder, recurrent, severe with psychotic symptoms: Secondary | ICD-10-CM

## 2016-01-01 MED ORDER — DULOXETINE HCL 20 MG PO CPEP
20.0000 mg | ORAL_CAPSULE | Freq: Every day | ORAL | Status: DC
  Administered 2016-01-01 – 2016-01-02 (×2): 20 mg via ORAL
  Filled 2016-01-01 (×5): qty 1

## 2016-01-01 MED ORDER — IBUPROFEN 400 MG PO TABS
400.0000 mg | ORAL_TABLET | Freq: Four times a day (QID) | ORAL | Status: DC | PRN
  Administered 2016-01-01 – 2016-01-03 (×6): 400 mg via ORAL
  Filled 2016-01-01 (×6): qty 1

## 2016-01-01 MED ORDER — GABAPENTIN 100 MG PO CAPS
100.0000 mg | ORAL_CAPSULE | Freq: Three times a day (TID) | ORAL | Status: DC
  Administered 2016-01-01 – 2016-01-02 (×4): 100 mg via ORAL
  Filled 2016-01-01 (×10): qty 1

## 2016-01-01 MED ORDER — METHOCARBAMOL 500 MG PO TABS
500.0000 mg | ORAL_TABLET | Freq: Three times a day (TID) | ORAL | Status: DC | PRN
  Administered 2016-01-02 – 2016-01-04 (×4): 500 mg via ORAL
  Filled 2016-01-01 (×4): qty 1

## 2016-01-01 MED ORDER — LIDOCAINE 5 % EX PTCH
1.0000 | MEDICATED_PATCH | Freq: Every day | CUTANEOUS | Status: DC
Start: 2016-01-01 — End: 2016-01-06
  Administered 2016-01-01 – 2016-01-06 (×6): 1 via TRANSDERMAL
  Filled 2016-01-01 (×10): qty 1

## 2016-01-01 MED ORDER — OXYCODONE-ACETAMINOPHEN 5-325 MG PO TABS
1.0000 | ORAL_TABLET | Freq: Once | ORAL | Status: AC
  Administered 2016-01-01: 1 via ORAL
  Filled 2016-01-01: qty 1

## 2016-01-01 NOTE — Progress Notes (Signed)
Adult Psychoeducational Group Note  Date:  01/01/2016 Time:  9:08 PM  Group Topic/Focus:  Wrap-Up Group:   The focus of this group is to help patients review their daily goal of treatment and discuss progress on daily workbooks.   Participation Level:  Did Not Attend  Participation Quality:  Did not attend  Affect:  Did not attend  Cognitive:  Did not attend  Insight: None  Engagement in Group:  Did not attend  Modes of Intervention:  Did not attend  Additional Comments:  Patient did not attend wrap up group tonight. Zoran Yankee L Camrin Lapre 01/01/2016, 9:08 PM

## 2016-01-01 NOTE — [facility] Suicide Risk Assessment (Signed)
[facility] Ad[facility] Suicide [facility] Assessment   Nursing information obtained from:  Patient Demographic factors:  Male, Age 465 or older, Divorced or widowed, Living alone, Unemployed Current Mental Status:  Suicidal ideation indicated by patient, Suicide plan, Plan includes specific time, place, or method, Self-harm thoughts, Self-harm behaviors Loss Factors:  Decline in physical health Historical Factors:  Prior suicide attempts Risk Reduction Factors:  NA  Total Time spent with patient: 45 minutes Principal Problem:  MDD , PTSD  Diagnosis:   Patient Active Problem List   Diagnosis Date Noted  . Major depressive disorder, recurrent severe without psychotic features (HCC) [F33.2] 12/31/2015  . Major depressive disorder, recurrent episode, severe, with psychosis (HCC) [F33.3] 12/31/2015     Continued Clinical Symptoms:  Alcohol Use Disorder Identification Test Final Score (AUDIT): 4 The "Alcohol Use Disorders Identification Test", Guidelines for Use in Primary Care, Second Edition.  World Science writerHealth Organization [facility](WHO). Score between 0-7:  no or low risk or alcohol related problems. Score between 8-15:  moderate risk of alcohol related problems. Score between 16-19:  high risk of alcohol related problems. Score 20 or above:  warrants further diagnostic evaluation for alcohol dependence and treatment.   CLINICAL FACTORS:  65 year old male, [place] [employer], history of depression and PTSD . Presents due to worsening depression, thoughts of suicide by pointing a gun so that he would be shot by a Emergency planning/management officerpolice officer. Describes significant and chronic back pain as a contributing factor to depression .  \  Psychiatric Specialty Exam: Physical Exam  ROS  Blood pressure 113/82, pulse 87, temperature 98.2 F (36.8 C), resp. rate 18, height 5\' 6"  (1.676 m), weight 216 lb (98 kg), SpO2 99 %.Body mass index is 34.86 kg/m.  See admit note MSE    COGNITIVE FEATURES THAT CONTRIBUTE TO RISK:   Closed-mindedness and Loss of executive function    SUICIDE RISK:   Moderate:  Frequent suicidal ideation with limited intensity, and duration, some specificity in terms of plans, no associated intent, good self-control, limited dysphoria/symptomatology, some risk factors present, and identifiable protective factors, including available and accessible social support.   PLAN OF CARE: Patient will be admitted to inpatient psychiatric unit for stabilization and safety. Will provide and encourage milieu participation. Provide medication management and maked adjustments as needed.  Will follow daily.    I certify that inpatient services furnished can reasonably be expected to improve the patient's condition.  Nehemiah MassedOBOS, Quetzaly Ebner, MD 01/01/2016, 12:52 PM

## 2016-01-01 NOTE — Tx Team (Signed)
Interdisciplinary Treatment and Diagnostic Plan Update  01/01/2016 Time of Session: 3:36 PM  CASANOVA SCHURMAN MRN: [id number]  Principal Diagnosis: Major Depressive Disorder, severe, with psychotic features  Secondary Diagnoses: Active Problems:   Major depressive disorder, recurrent episode, severe, with psychosis ([place])   Current Medications:  Current Facility-Administered Medications  Medication Dose Route Frequency Provider Last Rate Last Dose  . acetaminophen (TYLENOL) tablet 650 mg  650 mg Oral Q4H PRN Patrecia Pour, NP   650 mg at 01/01/16 1524  . alum & mag hydroxide-simeth (MAALOX/MYLANTA) 200-200-20 MG/5ML suspension 30 mL  30 mL Oral Q4H PRN Patrecia Pour, NP      . DULoxetine (CYMBALTA) DR capsule 20 mg  20 mg Oral Daily Jenne Campus, MD   20 mg at 01/01/16 1114  . gabapentin (NEURONTIN) capsule 100 mg  100 mg Oral TID Jenne Campus, MD   100 mg at 01/01/16 1114  . hydrOXYzine (ATARAX/VISTARIL) tablet 25 mg  25 mg Oral Q6H PRN Laverle Hobby, PA-C   25 mg at 12/31/15 2200  . ibuprofen (ADVIL,MOTRIN) tablet 400 mg  400 mg Oral Q6H PRN Jenne Campus, MD   400 mg at 01/01/16 1114  . lidocaine (LIDODERM) 5 % 1 patch  1 patch Transdermal Daily Jenne Campus, MD   1 patch at 01/01/16 1114  . magnesium hydroxide (MILK OF MAGNESIA) suspension 30 mL  30 mL Oral Daily PRN Patrecia Pour, NP      . methocarbamol (ROBAXIN) tablet 500 mg  500 mg Oral Q8H PRN Jenne Campus, MD      . traZODone (DESYREL) tablet 50 mg  50 mg Oral QHS,MR X 1 Spencer E Simon, PA-C   50 mg at 12/31/15 2200   PTA Medications: Prescriptions Prior to Admission  Medication Sig Dispense Refill Last Dose  . acetaminophen (TYLENOL) 500 MG tablet Take 1 tablet (500 mg total) by mouth every 6 (six) hours as needed. 30 tablet 0   . methocarbamol (ROBAXIN) 500 MG tablet Take 1 tablet (500 mg total) by mouth 2 (two) times daily as needed for muscle spasms. 20 tablet 0   . oxyCODONE-acetaminophen  (PERCOCET/ROXICET) 5-325 MG per tablet 1 to 2 tabs PO q6hrs  PRN for pain 15 tablet 0     Treatment Modalities: Medication Management, Group therapy, Case management,  1 to 1 session with clinician, Psychoeducation, Recreational therapy.   Physician Treatment Plan for Primary Diagnosis: Major Depressive Disorder, severe, with psychotic features Long Term Goal(s): Improvement in symptoms so as ready for discharge   Short Term Goals: Ability to verbalize feelings will improve, Ability to disclose and discuss suicidal ideas, Ability to identify and develop effective coping behaviors will improve and Ability to identify triggers associated with substance abuse/mental health issues will improve  Medication Management: Evaluate patient's response, side effects, and tolerance of medication regimen.  Therapeutic Interventions: 1 to 1 sessions, Unit Group sessions and Medication administration.  Evaluation of Outcomes: Not Met  Physician Treatment Plan for Secondary Diagnosis: Active Problems:   Major depressive disorder, recurrent episode, severe, with psychosis ([place])  Long Term Goal(s): Improvement in symptoms so as ready for discharge  Short Term Goals: Ability to disclose and discuss suicidal ideas and Ability to identify triggers associated with substance abuse/mental health issues will improve  Medication Management: Evaluate patient's response, side effects, and tolerance of medication regimen.  Therapeutic Interventions: 1 to 1 sessions, Unit Group sessions and Medication administration.  Evaluation of Outcomes: Not  Met   RN Treatment Plan for Primary Diagnosis: <principal problem not specified> Long Term Goal(s): Knowledge of disease and therapeutic regimen to maintain health will improve  Short Term Goals: Ability to verbalize frustration and anger appropriately will improve, Ability to verbalize feelings will improve, Ability to disclose and discuss suicidal ideas and Ability to  identify and develop effective coping behaviors will improve  Medication Management: RN will administer medications as ordered by provider, will assess and evaluate patient's response and provide education to patient for prescribed medication. RN will report any adverse and/or side effects to prescribing provider.  Therapeutic Interventions: 1 on 1 counseling sessions, Psychoeducation, Medication administration, Evaluate responses to treatment, Monitor vital signs and CBGs as ordered, Perform/monitor CIWA, COWS, AIMS and Fall Risk screenings as ordered, Perform wound care treatments as ordered.  Evaluation of Outcomes: Not Met   LCSW Treatment Plan for Primary Diagnosis: <principal problem not specified> Long Term Goal(s): Safe transition to appropriate next level of care at discharge, Engage patient in therapeutic group addressing interpersonal concerns.  Short Term Goals: Engage patient in aftercare planning with referrals and resources, Increase ability to appropriately verbalize feelings, Facilitate patient progression through stages of change regarding substance use diagnoses and concerns and Identify triggers associated with mental health/substance abuse issues  Therapeutic Interventions: Assess for all discharge needs, 1 to 1 time with Social worker, Explore available resources and support systems, Assess for adequacy in community support network, Educate family and significant other(s) on suicide prevention, Complete Psychosocial Assessment, Interpersonal group therapy.  Evaluation of Outcomes: Not Met  Progress in Treatment: Attending groups: Pt is new to milieu, continuing to assess  Participating in groups: Pt is new to milieu, continuing to assess  Taking medication as prescribed: Yes, MD continuing to assess for appropriate medication regimen Toleration medication: Yes Family/Significant other contact made: No, CSW attempting to make contact with sister Patient understands  diagnosis: Continuing to assess Discussing patient identified problems/goals with staff: Yes Medical problems stabilized or resolved: Yes Denies suicidal/homicidal ideation: No, recently admitted with SI Issues/concerns per patient self-inventory: None reported Other: N/A  New problem(s) identified: None reported at this time  New Short Term/Long Term Goal(s): None at this time  Discharge Plan or Barriers:  Pt plans to go back to [facility] and follow-up with the [place]. He is under the impression that he will be approved for a new apartment while he is in the hospital .  Reason for Continuation of Hospitalization:  Depression Hallucinations Medication stabilization Suicidal ideation  Estimated Length of Stay: 3-5 days  Attendees: Patient:   Physician: Dr. Parke Poisson, MD 01/01/2016 3:36 PM  Nursing:  01/01/2016 3:36 PM  RN Care Manager: Lars Pinks 01/01/2016 3:36 PM  Social Worker: Peri Maris, LCSW 01/01/2016 3:36 PM  Social Worker: Tilden Fossa, LCSW 01/01/2016 3:36 PM  Other: Samuel Jester, NP; Lindell Spar, NP 01/01/2016 3:36 PM  Other:  01/01/2016 3:36 PM  Other: 01/01/2016 3:36 PM    Scribe for Treatment Team: Bo Mcclintock, LCSW 01/01/2016 3:36 PM

## 2016-01-01 NOTE — Progress Notes (Signed)
DAR NOTE: Pt present with flat affect and depressed mood in the unit. Pt has been isolating himself and has been bed most of the time. Pt complained of back  pain, adocaine patch applied as orderd took all his meds as scheduled. As per self inventory, pt had a good fair sleep, poor appetite, low energy, and poor concentration. Pt rate depression at 0, hopeless ness at 0, and anxiety at 10. Pt's safety ensured with 15 minute and environmental checks. Pt currently denies SI/HI and A/V hallucinations. Pt verbally agrees to seek staff if SI/HI or A/VH occurs and to consult with staff before acting on these thoughts. Will continue POC.

## 2016-01-01 NOTE — Progress Notes (Signed)
Recreation Therapy Notes  Date: 01/01/16 Time: 0930 Location: [address] Group Room  Group Topic: Stress Management  Goal Area(s) Addresses:  Patient will verbalize importance of using healthy stress management.  Patient will identify positive emotions associated with healthy stress management.   Intervention: Stress Management  Activity :  Floating on [place].  LRT introduced the technique of guided imagery to the patients.  Patients were asked to follow along as LRT read the script to participate in the activity.  Education: Stress Management, Discharge Planning.   Education Outcome: Acknowledges edcuation/In group clarification offered/Needs additional education  Clinical Observations/Feedback:  Pt did not attend group.       Devon Harris, LRT/CTRS     Devon Harris 01/01/2016 12:16 PM 

## 2016-01-01 NOTE — H&P (Signed)
Psychiatric Admission Assessment Adult  Patient Identification: Devon Harris MRN:  [id number] Date of Evaluation:  01/01/2016 Chief Complaint:  " I am feeling down" Principal Diagnosis:  Major Depression, Recurrent, with Psychotic Features, PTSD by history Diagnosis:   Patient Active Problem List   Diagnosis Date Noted  . Major depressive disorder, recurrent severe without psychotic features ([place]) [F33.2] 12/31/2015  . Major depressive disorder, recurrent episode, severe, with psychosis ([place]) [F33.3] 12/31/2015   History of Present Illness:  Patient is a 65 year old male , who reports chronic depression which has been worsening . States " I have been feeling really down for a while ". States he has been thinking of suicide , with a thought of " getting shot maybe by a cop, by aiming my weapon".  He endorse neuro- vegetative symptoms of depression as below. He also endorses auditory hallucinations over recent weeks, telling him to " do it ". He cannot identify any acute stressors that may be causing his depression, but reports living alone, being socially isolated, and also significant chronic back pain . He does appear  Vaguely uncomfortable during session due to pain . Of note, UDS is positive for cannabis, opiates and cocaine- states he is prescribed percocet, takes it irregularly " a few times a week", denies abusing . Denies regular or frequent cocaine or cannabis use, states uses once or twice a week, partly to try to alleviate depression and back pain .  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 States he has had suicidal ideations, but denies suicidal attempts, states he has had hallucinations in the past when depressed , denies any history of mania or hypomania. States he has been diagnosed with PTSD related to combat experiences in [place] .   Is the patient at risk to self? Yes.    Has the patient been a risk to self in the past 6 months? Yes.    Has the patient been a risk to self within the distant past? Yes.    Is the patient a risk to others? Yes.    Has the patient been a risk to others in the past 6 months? No.  Has the patient been a risk to others within the distant past? No.   Prior Inpatient Therapy:  as above  Prior Outpatient Therapy:  goes to [facility], sees Dr. Mercie Eon ( unsure of spelling )   Alcohol Screening: 1. How often do you have a drink containing alcohol?: 2 to 4 times a month 2. How many drinks containing alcohol do you have on a typical day when you are drinking?: 1 or 2 3. How often do you have six or more drinks on one occasion?: Never Preliminary Score: 0 9. Have you or someone else been injured as a result of your drinking?: No 10. Has a relative or friend or a doctor or another health worker been concerned  about your drinking or suggested you cut down?: Yes, but not in the last year Alcohol Use Disorder Identification Test Final Score (AUDIT): 4 Brief Intervention: AUDIT score less than 7 or less-screening does not suggest unhealthy drinking-brief intervention not indicated Substance Abuse History in the last 12 months:   Denies alcohol abuse , of note UDS positive for opiates , cocaine, cannabis, states he does not use regularly, states uses once or twice a week, " to feel better and for my back pain ".  Consequences of Substance Abuse: Denies any  negative consequences from substance abuse  Previous Psychotropic Medications: states " I don't remember, I take what they give me at the [place]" As per admission PTA medication list, was on Robaxin , Percocet , no psychiatric medications listed .  Psychological Evaluations:  No  Past Medical History: describes chronic back pain - states he is prescribed percocet at [facility], takes once or twice a day  Past Medical History:  Diagnosis Date  . Gout   . Hypertension     Past Surgical History:  Procedure Laterality Date  . BACK SURGERY     Family History:  Parents have passed away, states " from old age", has three sisters  Family Psychiatric  History: denies mental illnesses, denies substance abuse, denies suicides in family  Tobacco Screening: Have you used any form of tobacco in the last 30 days? (Cigarettes, Smokeless Tobacco, Cigars, and/or Pipes): No Social History: divorced, states he has adult children, but states " I don't want to talk about them, it's like I don't have any ", he is an Scientist, research (life sciences), honorably discharged . He is unemployed, receives [employer] /[employer] pension. Lives alone.  History  Alcohol Use  . Yes     History  Drug Use No    Additional Social History:  Allergies:  No Known Allergies Lab Results:  Results for orders placed or performed during the hospital encounter of 12/31/15 (from the past 48 hour(s))  Comprehensive metabolic panel     Status: None   Collection Time: 12/31/15 12:23 PM  Result Value Ref Range   Sodium 138 135 - 145 mmol/L   Potassium 3.7 3.5 - 5.1 mmol/L   Chloride 106 101 - 111 mmol/L   CO2 24 22 - 32 mmol/L   Glucose, Bld 81 65 - 99 mg/dL   BUN 14 6 - 20 mg/dL   Creatinine, Ser 1.08 0.61 - 1.24 mg/dL   Calcium 9.3 8.9 - 10.3 mg/dL   Total Protein 7.8 6.5 - 8.1 g/dL   Albumin 4.2 3.5 - 5.0 g/dL   AST 17 15 - 41 U/L   ALT 19 17 - 63 U/L   Alkaline Phosphatase 77 38 - 126 U/L   Total Bilirubin 0.7 0.3 - 1.2 mg/dL   GFR calc non Af Amer >60 >60  mL/min   GFR calc Af Amer >60 >60 mL/min    Comment: (NOTE) The eGFR has been calculated using the CKD EPI equation. This calculation has not been validated in all clinical situations. eGFR's persistently <60 mL/min signify possible Chronic Kidney Disease.    Anion gap 8 5 - 15  Ethanol     Status: None   Collection Time: 12/31/15 12:23 PM  Result Value Ref Range   Alcohol, Ethyl (B) <5 <5 mg/dL    Comment:        LOWEST DETECTABLE LIMIT FOR SERUM ALCOHOL IS 5 mg/dL FOR MEDICAL PURPOSES ONLY   cbc     Status:  None   Collection Time: 12/31/15 12:23 PM  Result Value Ref Range   WBC 5.7 4.0 - 10.5 K/uL   RBC 5.13 4.22 - 5.81 MIL/uL   Hemoglobin 14.6 13.0 - 17.0 g/dL   HCT 44.3 39.0 - 52.0 %   MCV 86.4 78.0 - 100.0 fL   MCH 28.5 26.0 - 34.0 pg   MCHC 33.0 30.0 - 36.0 g/dL   RDW 13.9 11.5 - 15.5 %   Platelets 329 150 - 400 K/uL  Acetaminophen level     Status: Abnormal   Collection Time: 12/31/15 12:23 PM  Result Value Ref Range   Acetaminophen (Tylenol), Serum <10 (L) 10 - 30 ug/mL    Comment:        THERAPEUTIC CONCENTRATIONS VARY SIGNIFICANTLY. A RANGE OF 10-30 ug/mL MAY BE AN EFFECTIVE CONCENTRATION FOR MANY PATIENTS. HOWEVER, SOME ARE BEST TREATED AT CONCENTRATIONS OUTSIDE THIS RANGE. ACETAMINOPHEN CONCENTRATIONS >150 ug/mL AT 4 HOURS AFTER INGESTION AND >50 ug/mL AT 12 HOURS AFTER INGESTION ARE OFTEN ASSOCIATED WITH TOXIC REACTIONS.   Salicylate level     Status: None   Collection Time: 12/31/15 12:23 PM  Result Value Ref Range   Salicylate Lvl <1.9 2.8 - 30.0 mg/dL  Rapid urine drug screen (hospital performed)     Status: Abnormal   Collection Time: 12/31/15  5:34 PM  Result Value Ref Range   Opiates POSITIVE (A) NONE DETECTED   Cocaine POSITIVE (A) NONE DETECTED   Benzodiazepines NONE DETECTED NONE DETECTED   Amphetamines NONE DETECTED NONE DETECTED   Tetrahydrocannabinol POSITIVE (A) NONE DETECTED   Barbiturates NONE DETECTED NONE DETECTED    Comment:         DRUG SCREEN FOR MEDICAL PURPOSES ONLY.  IF CONFIRMATION IS NEEDED FOR ANY PURPOSE, NOTIFY LAB WITHIN 5 DAYS.        LOWEST DETECTABLE LIMITS FOR URINE DRUG SCREEN Drug Class       Cutoff (ng/mL) Amphetamine      1000 Barbiturate      200 Benzodiazepine   147 Tricyclics       829 Opiates          300 Cocaine          300 THC              50     Blood Alcohol level:  Lab Results  Component Value Date   ETH <5 56/21/3086    Metabolic Disorder Labs:  No results found for: HGBA1C, MPG No results found for: PROLACTIN No results found for: CHOL, TRIG, HDL, CHOLHDL, VLDL, LDLCALC  Current Medications: Current Facility-Administered Medications  Medication Dose Route Frequency Provider Last Rate Last Dose  . acetaminophen (TYLENOL) tablet 650 mg  650 mg Oral Q4H PRN Patrecia Pour, NP   650 mg at 12/31/15 2057  . alum & mag hydroxide-simeth (MAALOX/MYLANTA) 200-200-20 MG/5ML suspension 30 mL  30 mL Oral Q4H PRN Patrecia Pour, NP      . hydrOXYzine (ATARAX/VISTARIL) tablet 25 mg  25 mg Oral Q6H PRN Laverle Hobby, PA-C   25 mg at 12/31/15 2200  . ibuprofen (ADVIL,MOTRIN) tablet 600 mg  600 mg Oral Q6H PRN Laverle Hobby, PA-C      . magnesium hydroxide (MILK OF MAGNESIA) suspension 30 mL  30 mL Oral Daily PRN Patrecia Pour, NP      . traZODone (DESYREL) tablet 50 mg  50 mg Oral QHS,MR X 1 Spencer E Simon, PA-C   50 mg at  12/31/15 2200   PTA Medications: Prescriptions Prior to Admission  Medication Sig Dispense Refill Last Dose  . acetaminophen (TYLENOL) 500 MG tablet Take 1 tablet (500 mg total) by mouth every 6 (six) hours as needed. 30 tablet 0   . methocarbamol (ROBAXIN) 500 MG tablet Take 1 tablet (500 mg total) by mouth 2 (two) times daily as needed for muscle spasms. 20 tablet 0   . oxyCODONE-acetaminophen (PERCOCET/ROXICET) 5-325 MG per tablet 1 to 2 tabs PO q6hrs  PRN for pain 15 tablet 0     Musculoskeletal: Strength & Muscle Tone: within normal limits Gait  & Station: walks slowly due to back pain  Patient leans: N/A  Psychiatric Specialty Exam: Physical Exam  Review of Systems  Constitutional: Negative.   HENT: Negative.   Eyes: Negative.   Respiratory: Negative.   Cardiovascular: Negative.   Gastrointestinal: Negative.   Genitourinary: Negative.   Musculoskeletal: Positive for back pain and neck pain.  Skin: Negative.   Neurological: Negative for seizures.  Endo/Heme/Allergies: Negative.   Psychiatric/Behavioral: Positive for depression, hallucinations, substance abuse and suicidal ideas.  All other systems reviewed and are negative.   Blood pressure 113/82, pulse 87, temperature 98.2 F (36.8 C), resp. rate 18, height _0  (1.676 m), weight 216 lb (98 kg), SpO2 99 %.Body mass index is 34.86 kg/m.  General Appearance: Fairly Groomed  Eye Contact:  Minimal  Speech:  Slow  Volume:  Decreased  Mood:  depressed and irritable, dysphoric   Affect:  Constricted and vaguely irritable   Thought Process:  Linear  Orientation:  Other:  fully alert and attentive   Thought Content:  describes auditory hallucinations, no delusions expressed, does not appear internally preoccupied   Suicidal Thoughts:  Yes.  without intent/plan- denies plan or intention of hurting self or of suicide on unit, contracts for safety on unit   Homicidal Thoughts:  Denies any violent or homicidal ideations   Memory:  recent and remote grossly intact   Judgement:  Fair  Insight:  Fair  Psychomotor Activity:  mildly restless, which he attributes to back pain   Concentration:  Concentration: Good and Attention Span: Good  Recall:  Good  Fund of Knowledge:  Good  Language:  Good  Akathisia:  No  Handed:  Right  AIMS (if indicated):     Assets:  Desire for Improvement Resilience  ADL's:  Fair   Cognition:  WNL  Sleep:  Number of Hours: 6.75       Treatment Plan Summary: Daily contact with patient to assess and evaluate symptoms and progress in  treatment, Medication management, Plan inpatient admission  and medications as below  Observation Level/Precautions:  15 minute checks  Laboratory: EKG , TSH   Psychotherapy:  Milieu, support   Medications:  We discussed options, at this time patient denies regular opiate use, and does not present with WDL- will not start Opiate detox protocol at present . Agrees to work on managing chronic pain, which contributes to his depression, with non narcotic strategies- start Neurontin 100 mgrs TID, Cymbalta 20 mgrs QDAY- for depression, PTSD, and chronic pain , Lidoderm patch, Ibuprofen for pain PRN, Robaxin PRN.   Consultations:  Will consult PT to assess back pain, need for cane or walker , or other implement to improve gait, decrease pain   Discharge Concerns:    Estimated LOS: 6 days   Other:     I certify that inpatient services furnished can reasonably be expected to improve the patient's  condition.    Neita Garnet, MD 8/23/201710:10 AM

## 2016-01-01 NOTE — [facility] Counselor (Signed)
Adult Comprehensive Assessment  Patient ID: Devon Harris, male   DOB: [birth year], 65 y.o.   MRN: [id number]  Information Source: Information source: Patient  Current Stressors:  Educational / Learning stressors: None reported Employment / Job issues: None reported Family Relationships: estranged from 2/3 siblings Surveyor, quantityinancial / Lack of resources (include bankruptcy): None reported Housing / Lack of housing: Does not want to return to his house in [place] Physical health (include injuries & life threatening diseases): back pain Social relationships: socially isolated Substance abuse: polysubstance Bereavement / Loss: both parents deceased  Living/Environment/Situation:  Living Arrangements: Alone Living conditions (as described by patient or guardian): Pt did not state How long has patient lived in current situation?: "a while" What is atmosphere in current home: Temporary  Family History:  Marital status: Single Does patient have children?: No  Childhood History:  By whom was/is the patient raised?: Both parents Description of patient's relationship with caregiver when they were a child: "good" Patient's description of current relationship with people who raised him/her: parents are both deceased Does patient have siblings?: Yes Number of Siblings: 3 Description of patient's current relationship with siblings: only has a relationship with one of his sisters Did patient suffer any verbal/emotional/physical/sexual abuse as a child?: No Did patient suffer from severe childhood neglect?: No Has patient ever been sexually abused/assaulted/raped as an adolescent or adult?: No Was the patient ever a victim of a crime or a disaster?: No Witnessed domestic violence?: No Has patient been effected by domestic violence as an adult?: No  Education:  Highest grade of school patient has completed: High school Currently a student?: No Learning disability?: No  Employment/Work  Situation:   Employment situation: On disability Why is patient on disability: medical and mental health How long has patient been on disability: "years" Patient's job has been impacted by current illness: No What is the longest time patient has a held a job?: 22 years 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 Are There Guns or Other Weapons in Your Home?: No  Financial Resources:   Surveyor, quantityinancial resources: Insurance claims handlereceives SSDI, [employer] Nature conservation officer(Military retirement) Does patient have a Lawyerrepresentative payee or guardian?: No  Alcohol/Substance Abuse:   What has been your use of drugs/alcohol within the last 12 months?: polysubstance; Pt did not disclose Alcohol/Substance Abuse Treatment Hx:  (Unknown) Has alcohol/substance abuse ever caused legal problems?: Yes  Social Support System:   Conservation officer, natureatient's Community Support System: Poor Describe Community Support System: only his sister is supportive Type of faith/religion: Unknown; Pt forwarding little information How does patient's faith help to cope with current illness?: Unknown  Leisure/Recreation:   Leisure and Hobbies: Unknown; Pt forwarding little information  Strengths/Needs:   What things does the patient do well?: Unknown; Pt forwarding little information In what areas does patient struggle / problems for patient: Unknown; Pt forwarding little information  Discharge Plan:   Does patient have access to transportation?: Yes Will patient be returning to same living situation after discharge?: No Plan for living situation after discharge: [place] [facility] [place] [facility] does not have a place to stay at this  time [place] Currently receiving community mental health services: Yes (From Whom) [facility] [place]) If  no, would patient like referral for services when discharged?: No Does patient have financial barriers related to discharge medications?: No  Summary/Recommendations:     Patient is a 65 year old male with a diagnosis of Major Depressive Disorder, PTSD, and Polysubstance Abuse. Pt presented to the hospital with suicidal thoughts and request for substance abuse treatment. Pt reports primary trigger(s) for admission was feeling as if he has no purpose. Patient will benefit from crisis stabilization, medication evaluation, group therapy and psycho education in addition to case management for discharge planning. At discharge it is recommended that Pt remain compliant with established discharge plan and continued treatment.    [name]. 01/01/2016

## 2016-01-02 DIAGNOSIS — G8929 Other chronic pain: Secondary | ICD-10-CM

## 2016-01-02 DIAGNOSIS — F4310 Post-traumatic stress disorder, unspecified: Secondary | ICD-10-CM

## 2016-01-02 LAB — TSH: TSH: 0.319 u[IU]/mL — AB (ref 0.350–4.500)

## 2016-01-02 MED ORDER — TRAZODONE HCL 50 MG PO TABS
50.0000 mg | ORAL_TABLET | Freq: Every evening | ORAL | Status: DC | PRN
  Administered 2016-01-02 – 2016-01-05 (×4): 50 mg via ORAL
  Filled 2016-01-02 (×4): qty 1

## 2016-01-02 MED ORDER — GABAPENTIN 100 MG PO CAPS
200.0000 mg | ORAL_CAPSULE | Freq: Two times a day (BID) | ORAL | Status: DC
  Administered 2016-01-03 (×2): 200 mg via ORAL
  Filled 2016-01-02 (×5): qty 2

## 2016-01-02 MED ORDER — DULOXETINE HCL 20 MG PO CPEP
40.0000 mg | ORAL_CAPSULE | Freq: Every day | ORAL | Status: DC
  Administered 2016-01-03: 40 mg via ORAL
  Filled 2016-01-02 (×4): qty 2

## 2016-01-02 NOTE — Progress Notes (Signed)
D    Pt is pleasant on approach and cooperative   He tends to isolate to his room and did not attend group tonight   He expressed desire for discharge tomorrow and he is hoping to get [place] [facility]    Verbal support given   Medications administered and effectiveness monitored    Q 15 min checks R   Pt safe at present and receptive to verbal support

## 2016-01-02 NOTE — Progress Notes (Signed)
PT Cancellation Note  Patient Details Name: Devon MeierOscar L Amador MRN: [id number] DOB: 16-Dec-1950   Cancelled Treatment:    Reason Eval/Treat Not Completed: PT screened, no needs identified, will sign off. Spoke with pt who denied need for PT services. Pt stated "this is old pain-nothing that PT can do for it." Pt did inquire about a cane. [facility] to [facility] from [facility]-pt in agreement.    Rebeca AlertJannie Chanequa Spees, MPT Pager: [phone]

## 2016-01-02 NOTE — [facility] Group Notes (Signed)
[facility] 01/02/2016 1:15pm  Type of Therapy: Mental Health Association Presentation  Pt did not attend, declined invitation.    Chad CordialLauren Carter, LCSWA 01/02/2016 2:00 PM

## 2016-01-02 NOTE — Progress Notes (Signed)
Patient ID: Devon Harris, male   DOB: 1950/07/01, 65 y.o.   MRN: [id number] D: Client reports "I got to get out of this place, can I sign a discharge" "I'm trying to find a place where I can pay everything with my rent, you pay everything together, include it in my rent" "do you think the SW can help me with that?" [place] [facility] [place], I don't know nobody over here and it's hard to get around when you don't know nobody" A: Clinical research associateWriter provided emotional support encouraged client to speak with SW today about resources available. Medications reviewed and administered as ordered. Staff will monitor q6415min for safety. R: Client is safe on the unit, did not attend group.

## 2016-01-02 NOTE — Progress Notes (Signed)
Adult Psychoeducational Group Note  Date:  01/02/2016 Time:  9:47 PM  Group Topic/Focus:  Wrap-Up Group:   The focus of this group is to help patients review their daily goal of treatment and discuss progress on daily workbooks.   Participation Level:  Did Not Attend  Participation Quality:  Did not attend  Affect:  Did not attend  Cognitive:  Did not attend  Insight: None  Engagement in Group:  Did not attend  Modes of Intervention:  Did not attend  Additional Comments:  Patient did not attend wrap up group tonight.  Paityn Balsam L Gregory Dowe 01/02/2016, 9:47 PM

## 2016-01-02 NOTE — Progress Notes (Signed)
[facility] MD Progress Note  01/02/2016 3:12 PM Devon Harris  MRN:  [id number] Subjective:   Patient reports he is feeling somewhat better . More conversant and less irritable today. States he has been in [place] area for 2 years and feels that it has not been good for him. States he has limited friends, support, and that most of his friends, treatment providers are in [place]. ( [facility] )  States " it is time for me to move back to [place], I know I'll be happier there ". He also reports struggling with significant and chronic back pain , which contributes to his depression . Regarding PTSD symptoms, he states that these have been chronic, and are " there all the time, I have just learned to live with them ". Objective : I have discussed case with treatment team and have met with patient . Patient less depressed, less irritable today- presents better related, more communicative, and with a somewhat more reactive affect, smiles briefly at times . Denies suicidal ideations, and as noted, is future oriented, wanting to go to [place] after discharge . At present denies medication side effects. Chronic back pain is an issue, takes Percocet for it, but denies abusing . At this time feels that Neurontin/ Lidoderm patch are helping  " a little bit "- does appear more comfortable than on admission . No disruptive or agitated behaviors . Limited group/milieu participation . TSH slightly low- 0.319  Principal Problem:  Major Depression versus Depression secondary to chronic pain, PTSD by history  Diagnosis:   Patient Active Problem List   Diagnosis Date Noted  . Major depressive disorder, recurrent severe without psychotic features (HCC) [F33.2] 12/31/2015  . Major depressive disorder, recurrent episode, severe, with psychosis (HCC) [F33.3] 12/31/2015   Total Time spent with patient: 20 minutes    Past Medical History:  Past Medical History:  Diagnosis Date  . Gout   . Hypertension      Past Surgical History:  Procedure Laterality Date  . BACK SURGERY     Family History: History reviewed. No pertinent family history.  Social History:  History  Alcohol Use  . Yes     History  Drug Use No    Social History   Social History  . Marital status: Single    Spouse name: N/A  . Number of children: N/A  . Years of education: N/A   Social History Main Topics  . Smoking status: Never Smoker  . Smokeless tobacco: Never Used  . Alcohol use Yes  . Drug use: No  . Sexual activity: Not Asked   Other Topics Concern  . None   Social History Narrative  . None   Additional Social History:   Sleep: improved compared to prior to admission and states he did not have nightmares yesterday  Appetite:  Fair  Current Medications: Current Facility-Administered Medications  Medication Dose Route Frequency Provider Last Rate Last Dose  . alum & mag hydroxide-simeth (MAALOX/MYLANTA) 200-200-20 MG/5ML suspension 30 mL  30 mL Oral Q4H PRN Jamison Y Lord, NP      . DULoxetine (CYMBALTA) DR capsule 20 mg  20 mg Oral Daily  A , MD   20 mg at 01/02/16 0821  . gabapentin (NEURONTIN) capsule 100 mg  100 mg Oral TID  A , MD   100 mg at 01/02/16 1246  . hydrOXYzine (ATARAX/VISTARIL) tablet 25 mg  25 mg Oral Q6H PRN Spencer E Simon, PA-C   25   mg at 01/01/16 2200  . ibuprofen (ADVIL,MOTRIN) tablet 400 mg  400 mg Oral Q6H PRN  A , MD   400 mg at 01/02/16 1421  . lidocaine (LIDODERM) 5 % 1 patch  1 patch Transdermal Daily  A , MD   1 patch at 01/02/16 0821  . magnesium hydroxide (MILK OF MAGNESIA) suspension 30 mL  30 mL Oral Daily PRN Jamison Y Lord, NP      . methocarbamol (ROBAXIN) tablet 500 mg  500 mg Oral Q8H PRN  A , MD      . traZODone (DESYREL) tablet 50 mg  50 mg Oral QHS,MR X 1 Spencer E Simon, PA-C   50 mg at 01/01/16 2159    Lab Results:  Results for orders placed or performed during the hospital encounter of  12/31/15 (from the past 48 hour(s))  TSH     Status: Abnormal   Collection Time: 01/02/16  7:18 AM  Result Value Ref Range   TSH 0.319 (L) 0.350 - 4.500 uIU/mL    Comment: Performed at [facility]    Blood Alcohol level:  Lab Results  Component Value Date   ETH <5 12/31/2015    Metabolic Disorder Labs: No results found for: HGBA1C, MPG No results found for: PROLACTIN No results found for: CHOL, TRIG, HDL, CHOLHDL, VLDL, LDLCALC  Physical Findings: AIMS: Facial and Oral Movements Muscles of Facial Expression: None, normal Lips and Perioral Area: None, normal Jaw: None, normal Tongue: None, normal,Extremity Movements Upper (arms, wrists, hands, fingers): None, normal Lower (legs, knees, ankles, toes): None, normal, Trunk Movements Neck, shoulders, hips: None, normal, Overall Severity Severity of abnormal movements (highest score from questions above): None, normal Incapacitation due to abnormal movements: None, normal Patient's awareness of abnormal movements (rate only patient's report): No Awareness, Dental Status Current problems with teeth and/or dentures?: No Does patient usually wear dentures?: No  CIWA:    COWS:     Musculoskeletal: Strength & Muscle Tone: within normal limits Gait & Station: slow gait due to pain Patient leans: N/A  Psychiatric Specialty Exam: Physical Exam  ROS no headache, no chest pain, (+) back pain, no rash , no fever, no chills   Blood pressure 123/84, pulse 84, temperature 98.4 F (36.9 C), temperature source Oral, resp. rate 16, height 5' 6" (1.676 m), weight 216 lb (98 kg), SpO2 99 %.Body mass index is 34.86 kg/m.  General Appearance: Fairly Groomed  Eye Contact:  Improved eye contact today   Speech:  Normal Rate  Volume:  Normal  Mood:  still depressed,but improving compared admission presentation   Affect:  still constricted, but does smile at times   Thought Process:  Linear  Orientation:  Other:  fully alert  and attentive   Thought Content:  denies hallucinations, no delusions   Suicidal Thoughts:  denies suicidal ideations at this time, denies self injurious ideations   Homicidal Thoughts:  No- denies homicidal ideations , denies violent ideations   Memory:  recent and remote grossly intact  Judgement:  Fair  Insight:  Fair  Psychomotor Activity:  Decreased- no current restlessness or agitation   Concentration:  Concentration: Good and Attention Span: Good  Recall:  Good  Fund of Knowledge:  Good  Language:  Good  Akathisia:  No  Handed:  Right  AIMS (if indicated):     Assets:  Desire for Improvement Resilience  ADL's: improving   Cognition:  WNL  Sleep:  Number of Hours: 6.5   Assessment -   patient is a 65 year old male, history of depression, 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 Today presents with improving mood compared to admission, and presents less irritable, more communicative, and better related overall . He is future oriented and today focused on relocating to [place], as he states he has more of a support network there . Denies medication side effects. Appears more comfortable and not in any acute distress or pain at this time .   Treatment  Summary: Daily contact with patient to assess and evaluate symptoms and progress in treatment, Medication management, Plan inpatient treatment  and medications as below  Encourage increased group /milieu participation to work on Radiographer, therapeutic and symptom reduction  Treatment team working on disposition planning - as noted, patient interested in relocating to [place], where he has more of a support network. Follows up at [facility] treatment system. Of note, patient has submitted a letter requesting discharge. Increase Neurontin to 200 mgrs BID for pain and anxiety  Increase Cymbalta to 40 mgrs QDAY for depression, anxiety, PTSD  Continue Vistaril 25 mgrs Q 6 hours PRN for anxiety, as needed   Continue Trazodone 50 mgrs QHS PRN for insomnia as needed Check FT3, FT4, to follow up on low TSH. Neita Garnet, MD 01/02/2016, 3:12 PM

## 2016-01-02 NOTE — [facility] Group Notes (Signed)
[facility] Group Notes:  (Nursing/MHT/Case Management/Adjunct)  Date:  01/02/2016  Time:  2:40 PM  Type of Therapy:  Nurse Education  Participation Level:  Did Not Attend  Devon Harris 01/02/2016, 2:40 PM

## 2016-01-02 NOTE — Progress Notes (Signed)
DAR NOTE: Patient presents with depressed mood and affect. Denies auditory and visual hallucinations.  Rates depression at 0, hopelessness at 0, and anxiety at 0.  Maintained on routine safety checks.  Medications given as prescribed.  Support and encouragement offered as needed.  Attended group and participated.  States goal for today is "discharge."  Patient observed socializing with peers in the dayroom.  Patient received Motrin 600 mg x 2 for complain of back pain with fair effect.

## 2016-01-03 LAB — HEMOGLOBIN A1C
Hgb A1c MFr Bld: 6.2 % — ABNORMAL HIGH (ref 4.8–5.6)
Mean Plasma Glucose: 131 mg/dL

## 2016-01-03 LAB — T4, FREE: Free T4: 0.66 ng/dL (ref 0.61–1.12)

## 2016-01-03 MED ORDER — DULOXETINE HCL 60 MG PO CPEP
60.0000 mg | ORAL_CAPSULE | Freq: Every day | ORAL | Status: DC
  Filled 2016-01-03: qty 1

## 2016-01-03 MED ORDER — GABAPENTIN 300 MG PO CAPS
300.0000 mg | ORAL_CAPSULE | Freq: Three times a day (TID) | ORAL | Status: DC
  Administered 2016-01-04 – 2016-01-06 (×7): 300 mg via ORAL
  Filled 2016-01-03 (×13): qty 1

## 2016-01-03 MED ORDER — IBUPROFEN 600 MG PO TABS
600.0000 mg | ORAL_TABLET | Freq: Three times a day (TID) | ORAL | Status: DC | PRN
  Administered 2016-01-03 – 2016-01-04 (×3): 600 mg via ORAL
  Filled 2016-01-03 (×3): qty 1

## 2016-01-03 MED ORDER — DULOXETINE HCL 20 MG PO CPEP
40.0000 mg | ORAL_CAPSULE | Freq: Every day | ORAL | Status: DC
  Administered 2016-01-04 – 2016-01-06 (×3): 40 mg via ORAL
  Filled 2016-01-03 (×5): qty 2

## 2016-01-03 NOTE — Progress Notes (Signed)
Patient did not attend wrap up group. 

## 2016-01-03 NOTE — Progress Notes (Signed)
Patient ID: Devon Harris, male   DOB: 1950/11/03, 65 y.o.   MRN: [id number] [facility] MD Progress Note  01/03/2016 5:18 PM Devon Harris  MRN:  [id number] Subjective:   Patient reports some improvement . He does continue to report significant back pain, but states that medications ( cymbalta, neurontin, lidocaine patch), have been partially effective and he has been less focused on getting opiate analgesics. He remains future oriented, wanting to to [facility] after discharge. Of note, his plan as to what he will do once he gets to [facility] is tenuous- states " I may go to the [facility] hospital, or maybe a family member will let me stay there ".  Objective : I have discussed case with treatment team and have met with patient . As above, patient reports some improvement and does present with a partially improved, less depressed, less irritable affect . Describes chronic PTSD symptoms, and if these symptoms are asked about or  are explored, states " I really do not want to talk about that ", presents with significant avoidance about his PTSD/  combat , war time experiences . With his express consent and in his presence I spoke with his adult daughter and with his sister, and we were able to have a family interaction via phone speaker conversation . Family members agree patient might do better in [place] than in [place] as has more support there, but state they feel discharging at this time is premature, and that they are going to work on seeing which family member he can stay with once he gets there .  Patient had submitted a letter requesting discharge, but states he is willing to rescind it because he does realize " this is helping me ".  Tolerating medications well , no side effects. No disruptive or agitated behaviors on unit . Limited group/milieu participation , but more visible,less withdrawn.  Principal Problem:  Major Depression versus Depression secondary to chronic pain, PTSD by history  Diagnosis:    Patient Active Problem List   Diagnosis Date Noted  . Major depressive disorder, recurrent severe without psychotic features ([place]) [F33.2] 12/31/2015  . Major depressive disorder, recurrent episode, severe, with psychosis ([place]) [F33.3] 12/31/2015   Total Time spent with patient: 25 minutes     Past Medical History:  Past Medical History:  Diagnosis Date  . Gout   . Hypertension     Past Surgical History:  Procedure Laterality Date  . BACK SURGERY     Family History: History reviewed. No pertinent family history.  Social History:  History  Alcohol Use  . Yes     History  Drug Use No    Social History   Social History  . Marital status: Single    Spouse name: N/A  . Number of children: N/A  . Years of education: N/A   Social History Main Topics  . Smoking status: Never Smoker  . Smokeless tobacco: Never Used  . Alcohol use Yes  . Drug use: No  . Sexual activity: Not Asked   Other Topics Concern  . None   Social History Narrative  . None   Additional Social History:   Sleep: improved compared to prior to admission and states he did not have nightmares yesterday  Appetite:  Fair  Current Medications: Current Facility-Administered Medications  Medication Dose Route Frequency Provider Last Rate Last Dose  . alum & mag hydroxide-simeth (MAALOX/MYLANTA) 200-200-20 MG/5ML suspension 30 mL  30 mL Oral Q4H PRN Patrecia Pour,  NP      . Derrill Memo ON 01/04/2016] DULoxetine (CYMBALTA) DR capsule 60 mg  60 mg Oral Daily Jenne Campus, MD      . Derrill Memo ON 01/04/2016] gabapentin (NEURONTIN) capsule 300 mg  300 mg Oral TID Jenne Campus, MD      . hydrOXYzine (ATARAX/VISTARIL) tablet 25 mg  25 mg Oral Q6H PRN Laverle Hobby, PA-C   25 mg at 01/02/16 2136  . ibuprofen (ADVIL,MOTRIN) tablet 400 mg  400 mg Oral Q6H PRN Jenne Campus, MD   400 mg at 01/03/16 1442  . lidocaine (LIDODERM) 5 % 1 patch  1 patch Transdermal Daily Jenne Campus, MD   1 patch at 01/03/16  0753  . magnesium hydroxide (MILK OF MAGNESIA) suspension 30 mL  30 mL Oral Daily PRN Patrecia Pour, NP      . methocarbamol (ROBAXIN) tablet 500 mg  500 mg Oral Q8H PRN Jenne Campus, MD   500 mg at 01/03/16 1130  . traZODone (DESYREL) tablet 50 mg  50 mg Oral QHS PRN Jenne Campus, MD   50 mg at 01/02/16 2136    Lab Results:  Results for orders placed or performed during the hospital encounter of 12/31/15 (from the past 48 hour(s))  TSH     Status: Abnormal   Collection Time: 01/02/16  7:18 AM  Result Value Ref Range   TSH 0.319 (L) 0.350 - 4.500 uIU/mL    Comment: Performed at [facility]  Hemoglobin A1c     Status: Abnormal   Collection Time: 01/02/16  7:21 AM  Result Value Ref Range   Hgb A1c MFr Bld 6.2 (H) 4.8 - 5.6 %    Comment: (NOTE)         Pre-diabetes: 5.7 - 6.4         Diabetes: >6.4         Glycemic control for adults with diabetes: <7.0    Mean Plasma Glucose 131 mg/dL    Comment: (NOTE) Performed At: [facility] [place], [place] [id number] Lindon Romp MD OE:[id number] Performed at [facility]   T4, free     Status: None   Collection Time: 01/03/16  6:22 AM  Result Value Ref Range   Free T4 0.66 0.61 - 1.12 ng/dL    Comment: (NOTE) Biotin ingestion may interfere with free T4 tests. If the results are inconsistent with the TSH level, previous test results, or the clinical presentation, then consider biotin interference. If needed, order repeat testing after stopping biotin. Performed at [facility]     Blood Alcohol level:  Lab Results  Component Value Date   [facility] <5 15/40/0867    Metabolic Disorder Labs: Lab Results  Component Value Date   HGBA1C 6.2 (H) 01/02/2016   MPG 131 01/02/2016   No results found for: PROLACTIN No results found for: CHOL, TRIG, HDL, CHOLHDL, VLDL, LDLCALC  Physical Findings: AIMS: Facial and Oral Movements Muscles of Facial Expression:  None, normal Lips and Perioral Area: None, normal Jaw: None, normal Tongue: None, normal,Extremity Movements Upper (arms, wrists, hands, fingers): None, normal Lower (legs, knees, ankles, toes): None, normal, Trunk Movements Neck, shoulders, hips: None, normal, Overall Severity Severity of abnormal movements (highest score from questions above): None, normal Incapacitation due to abnormal movements: None, normal Patient's awareness of abnormal movements (rate only patient's report): No Awareness, Dental Status Current problems with teeth and/or dentures?: No Does patient usually  wear dentures?: No  CIWA:    COWS:     Musculoskeletal: Strength & Muscle Tone: within normal limits Gait & Station: slow gait due to pain Patient leans: N/A  Psychiatric Specialty Exam: Physical Exam  ROS no headache, no chest pain, (+) back pain, no rash , no fever, no chills   Blood pressure (!) 143/89, pulse 77, temperature 97.8 F (36.6 C), temperature source Oral, resp. rate 18, height 5' 6"  (1.676 m), weight 216 lb (98 kg), SpO2 99 %.Body mass index is 34.86 kg/m.  General Appearance: Fairly Groomed-improved compared to admission   Eye Contact:  Improved eye contact today   Speech:  Normal Rate  Volume:  Normal  Mood:  Less severely depressed   Affect:  Remains constricted, but does smile at times   Thought Process:  Linear  Orientation:  Other:  fully alert and attentive   Thought Content:  denies hallucinations, no delusions   Suicidal Thoughts:  denies suicidal ideations at this time, denies self injurious ideations   Homicidal Thoughts:  No- denies homicidal ideations , denies violent ideations   Memory:  recent and remote grossly intact  Judgement:  Fair  Insight:  Fair  Psychomotor Activity:  Decreased- no current restlessness or agitation   Concentration:  Concentration: Good and Attention Span: Good  Recall:  Good  Fund of Knowledge:  Good  Language:  Good  Akathisia:  No  Handed:   Right  AIMS (if indicated):     Assets:  Desire for Improvement Resilience  ADL's: improving   Cognition:  WNL  Sleep:  Number of Hours: 6.25   Assessment - patient presents with partial improvement compared to admission - focusing on relocating to [facility] on discharge and family members supportive of this, but stating they want to try to insure patient will be able to stay with family there rather than risk homelessness . He is tolerating medications well and denies side effects, pain partially improved . PTSD symptoms chronic and patient reluctant to even talk much about these- states he does have frequent painful  Intrusive memories about war experiences , and presents with avoidance symptoms  Treatment  Summary: Daily contact with patient to assess and evaluate symptoms and progress in treatment, Medication management, Plan inpatient treatment  and medications as below  Encourage increased group /milieu participation to work on Radiographer, therapeutic and symptom reduction  Treatment team working on disposition planning - see above  Increase Neurontin to 300 mgrs TID  for pain and anxiety  Continue  Cymbalta 40 mgrs QDAY for depression, anxiety, PTSD  Continue Vistaril 25 mgrs Q 6 hours PRN for anxiety, as needed  Continue Trazodone 50 mgrs QHS PRN for insomnia as needed Check FT3, FT4, to follow up on low TSH. Neita Garnet, MD 01/03/2016, 5:18 PM

## 2016-01-03 NOTE — Progress Notes (Signed)
Recreation Therapy Notes  Date: 01/03/16 Time: 0945 Location: 300 Hall Group Room  Group Topic: Stress Management  Goal Area(s) Addresses:  Patient will verbalize importance of using healthy stress management.  Patient will identify positive emotions associated with healthy stress management.   Intervention: Stress Management  Activity :  Progressive Muscle Relaxation.  LRT introduced the technique of progressive muscle relaxation to patients.  Patients were to follow along as LRT read script to engaged in the technique.  Education:  Stress Management, Discharge Planning.   Education Outcome: Acknowledges edcuation/In group clarification offered/Needs additional education  Clinical Observations/Feedback: Pt did not attend group.    Caroll RancherMarjette Demba Nigh, LRT/CTRS   Caroll RancherLindsay, Tracy Kinner A 01/03/2016 12:51 PM

## 2016-01-03 NOTE — [facility] Suicide Risk Assessment (Signed)
[facility] INPATIENT:  Family/Significant Other Suicide Prevention Education  Suicide Prevention Education:  Education Completed; Devon HolesKatie Harris, Pt's sister [phone], has been identified by the patient as the family member/significant other with whom the patient will be residing, and identified as the person(s) who will aid the patient in the event of a mental health crisis (suicidal ideations/suicide attempt).  With written consent from the patient, the family member/significant other has been provided the following suicide prevention education, prior to the and/or following the discharge of the patient.  The suicide prevention education provided includes the following:  Suicide risk factors  Suicide prevention and interventions  National Suicide Hotline telephone number  [facility] assessment telephone number  [facility] Assistance 911  [facility] and/or Residential Mobile Crisis Unit telephone number  Request made of family/significant other to:  Remove weapons (e.g., guns, rifles, knives), all items previously/currently identified as safety concern.    Remove drugs/medications (over-the-counter, prescriptions, illicit drugs), all items previously/currently identified as a safety concern.  The family member/significant other verbalizes understanding of the suicide prevention education information provided.  The family member/significant other agrees to remove the items of safety concern listed above.  Devon Harris 01/03/2016, 5:12 PM

## 2016-01-03 NOTE — Progress Notes (Signed)
Patient ID: Devon MeierOscar L Swett, male   DOB: 04-30-1951, 65 y.o.   MRN: [id number] D: Patient's mood is improved since yesterday.  He turned in his self inventory and stated, "It's all good."  Patient continues to complain of chronic back pain that is unresolved with current medication regiment.  He denies any thoughts of self harm.  He denies HI/AVH.  Patient is a possible discharge today. A: Continue to monitor medication management and MD orders.  Safety checks completed every 15 minutes per protocol.   R: Patient is receptive to staff; his behavior is appropriate.

## 2016-01-03 NOTE — [facility] Group Notes (Signed)
[facility] LCSW Group Therapy 01/03/2016 1:15pm  Type of Therapy: Group Therapy- Feelings Around Relapse and Recovery  Pt did not attend, declined invitation.   Nghia Mcentee Carter, LCSWA [phone] 01/03/2016 6:13 PM  

## 2016-01-03 NOTE — Progress Notes (Signed)
D    Pt is pleasant on approach and cooperative   He tends to isolate to his room and did not attend group tonight   He did not seem to be too upset over not being discharged today  He said his daughter blocked it  A    Verbal support given   Medications administered and effectiveness monitored    Q 15 min checks R   Pt safe at present and receptive to verbal support

## 2016-01-04 LAB — T3, FREE: T3, Free: 2.1 pg/mL (ref 2.0–4.4)

## 2016-01-04 NOTE — [facility] Group Notes (Signed)
[facility] LCSW Group Therapy  01/04/2016 10:00 until 11:00 AM  Type of Therapy:  Group Therapy  Participation Level:  Did Not Attend dispite overhead announcement and face to face encouragement to attend by LCSW.   Carney Bernatherine C Tiphani Mells, LCSW

## 2016-01-04 NOTE — Plan of Care (Signed)
Problem: Education: Goal: Emotional status will improve Outcome: Progressing Pt reports feeling much better since hospitalization  Problem: Health Behavior/Discharge Planning: Goal: Compliance with treatment plan for underlying cause of condition will improve Outcome: Progressing Pt is compliant with treatment

## 2016-01-04 NOTE — Progress Notes (Signed)
D    Pt is pleasant on approach and cooperative   He tends to isolate to his room and did  attend group tonight   [facility]    Verbal support given   Medications administered and effectiveness monitored    Q 15 min checks R   Pt safe at present and receptive to verbal support

## 2016-01-04 NOTE — Progress Notes (Signed)
Adult Psychoeducational Group Note  Date:  01/04/2016 Time:  9:20 PM  Group Topic/Focus:  Wrap-Up Group:   The focus of this group is to help patients review their daily goal of treatment and discuss progress on daily workbooks.   Participation Level:  Active  Participation Quality:  Appropriate and Sharing  Affect:  Appropriate  Cognitive:  Appropriate  Insight: Good  Engagement in Group:  Developing/Improving  Modes of Intervention:  Discussion  Additional Comments:  Patient reported wanting to continue to work on his discharge.  Patient confirmed working to increase his communication and interaction with others.  Patient identified a coping skill of listening to positive suggestions from others.    Elmore GuiseSLOAN, Elija Mccamish N 01/04/2016, 9:20 PM

## 2016-01-04 NOTE — Progress Notes (Signed)
Patient ID: Devon Harris, male   DOB: Apr 13, 1951, 65 y.o.   MRN: [id number] [facility] MD Progress Note  01/04/2016 6:08 PM Devon Harris  MRN:  [id number] Subjective:   Patient reports he is feeling better compared to admission . He is intent in relocating to [facility] once he is discharged . He has a home in [place]. Explains that he recently relapsed on drugs, and now associates this home with the relapse, and interactions with negative persons. States " therefore, I prefer to just move, and be done with it"  Denies medication side effects.Feels medications are helping . Chronic back pain still present, but has improved, and at this time he states it is more manageable.  Objective : I have reviewed chart notes  and have met with patient . Improving mood, although becomes upset , tearful, when discussing recent drug use. Future oriented, wanting to relocate to [facility] where he states he has more family and friends to support him. PTSD symptoms unchanged, states " not bothering me right now", but as noted describes chronic intrusive recollections and avoidance symptoms. Minimal group participation, but more visible on unit, less isolative . Tolerating medications well, feels they are helping .   Principal Problem:  Major Depression versus Depression secondary to chronic pain, PTSD by history  Diagnosis:   Patient Active Problem List   Diagnosis Date Noted  . Major depressive disorder, recurrent severe without psychotic features ([place]) [F33.2] 12/31/2015  . Major depressive disorder, recurrent episode, severe, with psychosis ([place]) [F33.3] 12/31/2015   Total Time spent with patient: 20 minutes     Past Medical History:  Past Medical History:  Diagnosis Date  . Gout   . Hypertension     Past Surgical History:  Procedure Laterality Date  . BACK SURGERY     Family History: History reviewed. No pertinent family history.  Social History:  History  Alcohol Use  . Yes     History  Drug Use  No    Social History   Social History  . Marital status: Single    Spouse name: N/A  . Number of children: N/A  . Years of education: N/A   Social History Main Topics  . Smoking status: Never Smoker  . Smokeless tobacco: Never Used  . Alcohol use Yes  . Drug use: No  . Sexual activity: Not Asked   Other Topics Concern  . None   Social History Narrative  . None   Additional Social History:   Sleep: improved compared to prior to admission and states he did not have nightmares yesterday  Appetite:  Improving   Current Medications: Current Facility-Administered Medications  Medication Dose Route Frequency Provider Last Rate Last Dose  . alum & mag hydroxide-simeth (MAALOX/MYLANTA) 200-200-20 MG/5ML suspension 30 mL  30 mL Oral Q4H PRN Patrecia Pour, NP      . DULoxetine (CYMBALTA) DR capsule 40 mg  40 mg Oral Daily Jenne Campus, MD   40 mg at 01/04/16 0835  . gabapentin (NEURONTIN) capsule 300 mg  300 mg Oral TID Jenne Campus, MD   300 mg at 01/04/16 1635  . hydrOXYzine (ATARAX/VISTARIL) tablet 25 mg  25 mg Oral Q6H PRN Laverle Hobby, PA-C   25 mg at 01/03/16 2159  . ibuprofen (ADVIL,MOTRIN) tablet 600 mg  600 mg Oral Q8H PRN Jenne Campus, MD   600 mg at 01/04/16 0837  . lidocaine (LIDODERM) 5 % 1 patch  1 patch Transdermal Daily Felicita Gage  A Lyndsee Casa, MD   1 patch at 01/04/16 0835  . magnesium hydroxide (MILK OF MAGNESIA) suspension 30 mL  30 mL Oral Daily PRN Patrecia Pour, NP      . methocarbamol (ROBAXIN) tablet 500 mg  500 mg Oral Q8H PRN Jenne Campus, MD   500 mg at 01/03/16 2159  . traZODone (DESYREL) tablet 50 mg  50 mg Oral QHS PRN Jenne Campus, MD   50 mg at 01/03/16 2159    Lab Results:  Results for orders placed or performed during the hospital encounter of 12/31/15 (from the past 48 hour(s))  T3, free     Status: None   Collection Time: 01/03/16  6:22 AM  Result Value Ref Range   T3, Free 2.1 2.0 - 4.4 pg/mL    Comment: (NOTE) Performed  At: [facility] [place], [place] [id number] Lindon Romp MD [id number] Performed at [facility]   T4, free     Status: None   Collection Time: 01/03/16  6:22 AM  Result Value Ref Range   Free T4 0.66 0.61 - 1.12 ng/dL    Comment: (NOTE) Biotin ingestion may interfere with free T4 tests. If the results are inconsistent with the TSH level, previous test results, or the clinical presentation, then consider biotin interference. If needed, order repeat testing after stopping biotin. Performed at [facility]     Blood Alcohol level:  Lab Results  Component Value Date   [facility] <5 42/70/6237    Metabolic Disorder Labs: Lab Results  Component Value Date   HGBA1C 6.2 (H) 01/02/2016   MPG 131 01/02/2016   No results found for: PROLACTIN No results found for: CHOL, TRIG, HDL, CHOLHDL, VLDL, LDLCALC  Physical Findings: AIMS: Facial and Oral Movements Muscles of Facial Expression: None, normal Lips and Perioral Area: None, normal Jaw: None, normal Tongue: None, normal,Extremity Movements Upper (arms, wrists, hands, fingers): None, normal Lower (legs, knees, ankles, toes): None, normal, Trunk Movements Neck, shoulders, hips: None, normal, Overall Severity Severity of abnormal movements (highest score from questions above): None, normal Incapacitation due to abnormal movements: None, normal Patient's awareness of abnormal movements (rate only patient's report): No Awareness, Dental Status Current problems with teeth and/or dentures?: No Does patient usually wear dentures?: No  CIWA:    COWS:     Musculoskeletal: Strength & Muscle Tone: within normal limits Gait & Station: slow gait due to pain Patient leans: N/A  Psychiatric Specialty Exam: Physical Exam  ROS no headache, no chest pain, (+) back pain, no rash , no fever, no chills   Blood pressure (!) 151/92, pulse 84, temperature 97.8 F (36.6 C), temperature source  Oral, resp. rate 20, height _0  (1.676 m), weight 216 lb (98 kg), SpO2 99 %.Body mass index is 34.86 kg/m.  General Appearance: improved grooming   Eye Contact:  Improved eye contact today   Speech:  Normal Rate  Volume:  Normal  Mood:  Less depressed   Affect:  Still labile at times, but overall less constricted , more reactive  Thought Process:  Linear  Orientation:  Other:  fully alert and attentive   Thought Content:  denies hallucinations, no delusions   Suicidal Thoughts:  denies suicidal ideations at this time, denies self injurious ideations   Homicidal Thoughts:  No- denies homicidal ideations , denies violent ideations   Memory:  recent and remote grossly intact  Judgement:  Improving   Insight:  Improving  Psychomotor Activity:  Improving , more visible on unit   Concentration:  Concentration: Good and Attention Span: Good  Recall:  Good  Fund of Knowledge:  Good  Language:  Good  Akathisia:  No  Handed:  Right  AIMS (if indicated):     Assets:  Desire for Improvement Resilience  ADL's: improving   Cognition:  WNL  Sleep:  Number of Hours: 6.5   Assessment - improving mood, more reactive affect, more visible on unit, although group participation and interactions with peers remain limited . Tolerating medications well, chronic back pain has decreased in severity . Focused on relocating to [facility], partly because of his recent relapse , leading him to want to go to a different place where he has more support   Treatment  Summary: Daily contact with patient to assess and evaluate symptoms and progress in treatment, Medication management, Plan inpatient treatment  and medications as below  Encourage increased group /milieu participation to work on Radiographer, therapeutic and symptom reduction  Treatment team working on disposition planning - see above  Continue Neurontin  300 mgrs TID  for pain and anxiety  Continue  Cymbalta 40 mgrs QDAY for depression, anxiety, PTSD  Continue  Vistaril 25 mgrs Q 6 hours PRN for anxiety, as needed  Continue Trazodone 50 mgrs QHS PRN for insomnia as needed Neita Garnet, MD 01/04/2016, 6:08 PM

## 2016-01-04 NOTE — [facility] Group Notes (Addendum)
[facility] Group Notes:  (Nursing/MHT/Case Management/Adjunct)  Date:  01/04/2016  Time:  0930  Type of Therapy:  Nurse Education  Participation Level:  Did Not Attend  Participation Quality:    Affect:    Cognitive:    Insight:    Engagement in Group:    Modes of Intervention:    Summary of Progress/Problems: Patient invited to group however elected not to attend.  Emberleigh Reily Eakes 01/04/2016, 1000 

## 2016-01-04 NOTE — Progress Notes (Signed)
D: Patient up and visible in the milieu. Spoke with patient 1:1. Rates sleep as good, appetite as good, energy as normal and concentration as good. Patient's affect is flat though he brightens slightly on aproach, mood depressed but pleasant. Rating his depression at a 3/10, hopelessness at a 3/10 and anxiety at a 3/10. States goal for today is to "make it to Monday when I will be leaving." Complained of back pain this AM of a 7/10 which resolved with advil prn.   A: Medicated per orders. Emotional support offered and self inventory reviewed. Fall precautions in place and reviewed with patients.   R: Patient verbalizes understanding. He endorses hallucinations but will not elaborate. Patient denies SI/HI and remains safe on level III obs.

## 2016-01-04 NOTE — Plan of Care (Signed)
Problem: Safety: Goal: Periods of time without injury will increase Outcome: Progressing Patient has not engaged in self harm, denies SI.  Problem: Medication: Goal: Compliance with prescribed medication regimen will improve Outcome: Progressing Patient has been med compliant.   

## 2016-01-05 MED ORDER — AMLODIPINE BESYLATE 5 MG PO TABS
5.0000 mg | ORAL_TABLET | Freq: Every day | ORAL | Status: DC
  Administered 2016-01-05 – 2016-01-06 (×2): 5 mg via ORAL
  Filled 2016-01-05 (×5): qty 1

## 2016-01-05 NOTE — Progress Notes (Addendum)
Patient ID: Devon Harris, male   DOB: Feb 26, 1951, 65 y.o.   MRN: [id number] [facility] MD Progress Note  01/05/2016 4:34 PM Devon Harris  MRN:  [id number] Subjective:   Patient reports significant improvement today- he states that he notices he feels more sociable and has been spending time talking to selected peers, states " I have been out of my room more ".  He reports partially improved back pain . At this time denies medication side effects. Patient more communicative today and elaborated on recent stressors more than he had earlier during admission . States that he has had periods of doing well, being able to focus and work, states that he had recently started " hanging out around wrong people, doing things I should not do", and specifically states he started dating and engaging with women much younger than him, who were using drugs and interested in him only for money or possible drugs . In this context he relapsed . States " so I need to focus on my own self and leave that type of woman alone, and that is why I want to go back to [place] where I have friends and family ".  Patient requested that I call his daughter to confirm she intends to come tomorrow to pick him up- I did so and she did confirm that she plans to be here tomorrow around noon time .  Objective : I have reviewed chart notes  and have met with patient . Improving mood, fuller range of affect, more communicative and increasing insight into triggers for relapse, as above .  Not complaining of PTSD symptoms at this time, and presenting less irritable, less isolative. Increased group participation , more sociable and interactive . Tolerating medications well, feels they are helping .    Principal Problem:  Major Depression versus Depression secondary to chronic pain, PTSD by history  Diagnosis:   Patient Active Problem List   Diagnosis Date Noted  . Major depressive disorder, recurrent severe without psychotic features ([place])  [F33.2] 12/31/2015  . Major depressive disorder, recurrent episode, severe, with psychosis ([place]) [F33.3] 12/31/2015   Total Time spent with patient: 20 minutes     Past Medical History:  Past Medical History:  Diagnosis Date  . Gout   . Hypertension     Past Surgical History:  Procedure Laterality Date  . BACK SURGERY     Family History: History reviewed. No pertinent family history.  Social History:  History  Alcohol Use  . Yes     History  Drug Use No    Social History   Social History  . Marital status: Single    Spouse name: N/A  . Number of children: N/A  . Years of education: N/A   Social History Main Topics  . Smoking status: Never Smoker  . Smokeless tobacco: Never Used  . Alcohol use Yes  . Drug use: No  . Sexual activity: Not Asked   Other Topics Concern  . None   Social History Narrative  . None   Additional Social History:   Sleep: improved compared to prior to admission and states he did not have nightmares yesterday  Appetite:  Improving   Current Medications: Current Facility-Administered Medications  Medication Dose Route Frequency Provider Last Rate Last Dose  . alum & mag hydroxide-simeth (MAALOX/MYLANTA) 200-200-20 MG/5ML suspension 30 mL  30 mL Oral Q4H PRN Patrecia Pour, NP      . DULoxetine (CYMBALTA) DR capsule 40 mg  40 mg Oral Daily Jenne Campus, MD   40 mg at 01/05/16 0926  . gabapentin (NEURONTIN) capsule 300 mg  300 mg Oral TID Jenne Campus, MD   300 mg at 01/05/16 1152  . hydrOXYzine (ATARAX/VISTARIL) tablet 25 mg  25 mg Oral Q6H PRN Laverle Hobby, PA-C   25 mg at 01/04/16 2110  . ibuprofen (ADVIL,MOTRIN) tablet 600 mg  600 mg Oral Q8H PRN Jenne Campus, MD   600 mg at 01/04/16 2110  . lidocaine (LIDODERM) 5 % 1 patch  1 patch Transdermal Daily Jenne Campus, MD   1 patch at 01/05/16 [phone]  . magnesium hydroxide (MILK OF MAGNESIA) suspension 30 mL  30 mL Oral Daily PRN Patrecia Pour, NP      . methocarbamol  (ROBAXIN) tablet 500 mg  500 mg Oral Q8H PRN Jenne Campus, MD   500 mg at 01/04/16 2110  . traZODone (DESYREL) tablet 50 mg  50 mg Oral QHS PRN Jenne Campus, MD   50 mg at 01/04/16 2110    Lab Results:  No results found for this or any previous visit (from the past 48 hour(s)).  Blood Alcohol level:  Lab Results  Component Value Date   ETH <5 88/91/6945    Metabolic Disorder Labs: Lab Results  Component Value Date   HGBA1C 6.2 (H) 01/02/2016   MPG 131 01/02/2016   No results found for: PROLACTIN No results found for: CHOL, TRIG, HDL, CHOLHDL, VLDL, LDLCALC  Physical Findings: AIMS: Facial and Oral Movements Muscles of Facial Expression: None, normal Lips and Perioral Area: None, normal Jaw: None, normal Tongue: None, normal,Extremity Movements Upper (arms, wrists, hands, fingers): None, normal Lower (legs, knees, ankles, toes): None, normal, Trunk Movements Neck, shoulders, hips: None, normal, Overall Severity Severity of abnormal movements (highest score from questions above): None, normal Incapacitation due to abnormal movements: None, normal Patient's awareness of abnormal movements (rate only patient's report): No Awareness, Dental Status Current problems with teeth and/or dentures?: No Does patient usually wear dentures?: No  CIWA:    COWS:     Musculoskeletal: Strength & Muscle Tone: within normal limits Gait & Station: slow gait due to pain Patient leans: N/A  Psychiatric Specialty Exam: Physical Exam  ROS no headache, no chest pain, (+) back pain, no rash , no fever, no chills   Blood pressure (!) 143/99, pulse 74, temperature 98 F (36.7 C), temperature source Oral, resp. rate 16, height _0  (1.676 m), weight 216 lb (98 kg), SpO2 99 %.Body mass index is 34.86 kg/m.  General Appearance: improved grooming   Eye Contact:  Improved eye contact today   Speech:  Normal Rate  Volume:  Normal  Mood:  Improving mood and improving range of affect    Affect:   less constricted , more reactive, not irritable at this time  Thought Process:  Linear  Orientation:  Other:  fully alert and attentive   Thought Content:  denies hallucinations, no delusions   Suicidal Thoughts:  denies suicidal ideations at this time, denies self injurious ideations   Homicidal Thoughts:  No- denies homicidal ideations , denies violent ideations   Memory:  recent and remote grossly intact  Judgement:  Improving   Insight:  Improving   Psychomotor Activity:  Improving , more visible on unit   Concentration:  Concentration: Good and Attention Span: Good  Recall:  Good  Fund of Knowledge:  Good  Language:  Good  Akathisia:  No  Handed:  Right  AIMS (if indicated):     Assets:  Desire for Improvement Resilience  ADL's: improving   Cognition:  WNL  Sleep:  Number of Hours: 6.5   Assessment -continues to improve gradually, presents with improved mood and range of affect and decreased irritability, more social interaction in milieu, focused on discharge soon and at patient's request I have confirmed that daughter plans to pick him up tomorrow  Of note, patient has history of HTN- BPs have tended to be elevated , 143/99 today, no associated symptoms- we reviewed, patient reports he had been taking Norvasc in the past, without side effects.  Treatment  Summary: Daily contact with patient to assess and evaluate symptoms and progress in treatment, Medication management, Plan inpatient treatment  and medications as below  Encourage increased group /milieu participation to work on Radiographer, therapeutic and symptom reduction  Treatment team working on disposition planning - see above  Continue Neurontin  300 mgrs TID  for pain and anxiety  Start Norvasc 5 mgrs QDAY for HTN Continue  Cymbalta 40 mgrs QDAY for depression, anxiety, PTSD  Continue Vistaril 25 mgrs Q 6 hours PRN for anxiety, as needed  Continue Trazodone 50 mgrs QHS PRN for insomnia as needed Neita Garnet,  MD 01/05/2016, 4:34 PM

## 2016-01-05 NOTE — Progress Notes (Signed)
D [name] is OOB UAL on the 400 hall today he tolerates this well. He smiles, is friendly and cooperative with staff and enjjoys talking and visiting with staff. A HE completes his daily assessment and on it he writes he denies SI today and he does not answer the questions where he is asked to numerically rate his feelings of depression, anxiety and hopelessness. HE talks about wanting to return [place] in the [place] area...that [place] and that now that he's had a chance to think about it, [place]where he will have more family support around him. He contracts with Clinical research associatewriter for safety and returns to sit in the dayroom. R Safety is in place.

## 2016-01-05 NOTE — BHH Group Notes (Signed)
B[facility] Group Notes:  (Nursing/MHT/Case Management/Adjunct)  Date:  01/05/2016  Time:  10:42 AM  Type of Therapy:  Nurse Education  Participation Level:  Did Not Attend  Summary of Progress/Problems: Pt did not attend but was invited.   Maurine SimmeringShugart, Lauretta Sallas M 01/05/2016, 10:42 AM

## 2016-01-05 NOTE — Progress Notes (Signed)
Adult Psychoeducational Group Note  Date:  01/05/2016 Time:  11:18 PM  Group Topic/Focus:  Wrap-Up Group:   The focus of this group is to help patients review their daily goal of treatment and discuss progress on daily workbooks.   Participation Level:  Active  Participation Quality:  Appropriate  Affect:  Angry and Appropriate  Cognitive:  Alert and Oriented  Insight: Appropriate  Engagement in Group:  Engaged  Modes of Intervention:  Discussion  Additional Comments:  Patient attended group and said his day was a 2.  His goal for today was to socialize and he did.  His coping skills were listening and reading.  Kasyn Rolph W Fanta Wimberley 01/05/2016, 11:18 PM

## 2016-01-06 DIAGNOSIS — Z7982 Long term (current) use of aspirin: Secondary | ICD-10-CM

## 2016-01-06 DIAGNOSIS — Z79891 Long term (current) use of opiate analgesic: Secondary | ICD-10-CM

## 2016-01-06 DIAGNOSIS — Z79899 Other long term (current) drug therapy: Secondary | ICD-10-CM

## 2016-01-06 DIAGNOSIS — F332 Major depressive disorder, recurrent severe without psychotic features: Principal | ICD-10-CM

## 2016-01-06 MED ORDER — AMLODIPINE BESYLATE 5 MG PO TABS: 5.0000 mg | ORAL_TABLET | Freq: Every day | ORAL | 0 refills | Status: DC

## 2016-01-06 MED ORDER — METHOCARBAMOL 500 MG PO TABS: 500.0000 mg | ORAL_TABLET | Freq: Two times a day (BID) | ORAL | 0 refills | Status: DC | PRN

## 2016-01-06 MED ORDER — LIDOCAINE 5 % EX PTCH: 1.0000 | MEDICATED_PATCH | Freq: Every day | CUTANEOUS | 0 refills | Status: DC

## 2016-01-06 MED ORDER — DULOXETINE HCL 40 MG PO CPEP: 40.0000 mg | ORAL_CAPSULE | Freq: Every day | ORAL | 0 refills | Status: DC

## 2016-01-06 MED ORDER — TRAZODONE HCL 50 MG PO TABS: 50.0000 mg | ORAL_TABLET | Freq: Every evening | ORAL | 0 refills | Status: DC | PRN

## 2016-01-06 MED ORDER — HYDROXYZINE HCL 25 MG PO TABS: 25.0000 mg | ORAL_TABLET | Freq: Four times a day (QID) | ORAL | 0 refills | Status: DC | PRN

## 2016-01-06 MED ORDER — GABAPENTIN 300 MG PO CAPS: 300.0000 mg | ORAL_CAPSULE | Freq: Three times a day (TID) | ORAL | 0 refills | Status: AC

## 2016-01-06 NOTE — [facility] Group Notes (Signed)
[facility] Note   01/06/2016 1:02 PM  Participation Quality:  Invited, did not attend.   Sallee LangeAnne C Cunningham

## 2016-01-06 NOTE — BHH Suicide Risk Assessment (Signed)
[facility]   Principal Problem: <principal problem not specified> Discharge Diagnoses:  Patient Active Problem List   Diagnosis Date Noted  . Major depressive disorder, recurrent severe without psychotic features (HCC) [F33.2] 12/31/2015  . Major depressive disorder, recurrent episode, severe, with psychosis (HCC) [F33.3] 12/31/2015    Total Time spent with patient: 30 minutes  Musculoskeletal: Strength & Muscle Tone: within normal limits Gait & Station: normal Patient leans: N/A  Psychiatric Specialty Exam: ROS  Blood pressure (!) 134/92, pulse 84, temperature 97.8 F (36.6 C), temperature source Oral, resp. rate 16, height 5\' 6"  (1.676 m), weight 98 kg (216 lb), SpO2 99 %.Body mass index is 34.86 kg/m.  General Appearance: Casual  Eye Contact::  Good  Speech:  Clear and Coherent and Normal Rate409  Volume:  Normal  Mood:  Euthymic  Affect:  Appropriate  Thought Process:  Coherent and Goal Directed  Orientation:  Full (Time, Place, and Person)  Thought Content:  Logical  Suicidal Thoughts:  No  Homicidal Thoughts:  No  Memory:  Immediate;   Good Recent;   Good Remote;   Good  Judgement:  Good  Insight:  Fair  Psychomotor Activity:  Normal  Concentration:  Good  Recall:  Good  Fund of Knowledge:Good  Language: Good  Akathisia:  No  Handed:  Right  AIMS (if indicated):     Assets:  Communication Skills Desire for Improvement Financial Resources/Insurance Housing Resilience Social Support Transportation  Sleep:  Number of Hours: 6.25  Cognition: WNL  ADL's:  Intact   Mental Status Per Nursing Assessment::   On Admission:  Suicidal ideation indicated by patient, Suicide plan, Plan includes specific time, place, or method, Self-harm thoughts, Self-harm behaviors  Demographic Factors:  Male, Age 66 or older, Divorced or widowed and Living alone but daughter is very supportive  Loss Factors: NA  Historical Factors: NA  Risk  Reduction Factors:   Sense of responsibility to family, Religious beliefs about death and Positive social support  Continued Clinical Symptoms:  None  Cognitive Features That Contribute To Risk:  None    Suicide Risk:  Minimal: No identifiable suicidal ideation.  Patients presenting with no risk factors but with morbid ruminations; may be classified as minimal risk based on the severity of the depressive symptoms  Follow-up Information    [facility] on [facility]17/2016.   Why:  Hospital discharge follow up appointment w Dr. Orson AloeHenderson, your primary care doctor, on Sept 8th at 9 AM.  PCP is requesting consult for mental health services.  [facility], [facility] and request mental health consult. Contact information: [facility] [address] [place], [place] [address] [phone]  Fax: [phone]          Plan Of Care/Follow-up recommendations:  Activity:  already a member of a church but will be exploring other churches  Follow up with outpatient treatment recommendations  Wynelle BourgeoisUreh N Lekauwa, MD 01/06/2016, 10:34 AM

## 2016-01-06 NOTE — Discharge Summary (Signed)
Physician Discharge Summary Note  Patient:  Devon Harris is an 65 y.o., male MRN:  [id number] DOB:  06-22-1950 Patient phone:  [phone] (home)  Patient address:   [address] [place] [id number],   Total Time spent with patient: Greater than 30 minutes.  Date of Admission:  12/31/2015  Date of Discharge: 01-06-16  Reason for Admission:  Worsening symptoms of depression without psychotic features.  Principal Problem: Major depressive disorder, recurrent, severe without psychotic features.  Discharge Diagnoses: Patient Active Problem List   Diagnosis Date Noted  . Major depressive disorder, recurrent severe without psychotic features (HCC) [F33.2] 12/31/2015  . Major depressive disorder, recurrent episode, severe, with psychosis (HCC) [F33.3] 12/31/2015   Past Psychiatric History: Major depressive disorder  Past Medical History:  Past Medical History:  Diagnosis Date  . Gout   . Hypertension     Past Surgical History:  Procedure Laterality Date  . BACK SURGERY     Family History: History reviewed. No pertinent family history.  Family Psychiatric  History: See H&P  Social History:  History  Alcohol Use  . Yes     History  Drug Use No    Social History   Social History  . Marital status: Single    Spouse name: N/A  . Number of children: N/A  . Years of education: N/A   Social History Main Topics  . Smoking status: Never Smoker  . Smokeless tobacco: Never Used  . Alcohol use Yes  . Drug use: No  . Sexual activity: Not Asked   Other Topics Concern  . None   Social History Narrative  . None   Hospital Course: Patient is a 65 year old male , who reports chronic depression which has been worsening . States " I have been feeling really down for a while ". States he has been thinking of suicide , with a thought of " getting shot maybe by a cop, by aiming my weapon".  He endorse neuro- vegetative symptoms of depression as below. He also endorses  auditory hallucinations over recent weeks, telling him to " do it ". He cannot identify any acute stressors that may be causing his depression, but reports living alone, being socially isolated, and also significant chronic back pain . He does appear vaguely uncomfortable during session due to pain . Of note, UDS is positive for cannabis, opiates and cocaine- states he is prescribed percocet, takes it irregularly " a few times a week", denies abusing . Denies regular or frequent cocaine or cannabis use, states uses once or twice a week, partly to try to alleviate depression and back pain .  Devon Harris was admitted to the hospital for worsening symptoms of depression with psychotic features & crisis management due to suicidal ideations. His UDS on admission was positive for Opioid, Cocaine & THC. He admits irregular drug use. He was not presenting with any substance withdrawal symptoms & did not receive any detoxification treatments. After his admission assessment, Kayan's presenting symptoms were identified. The medication regimen targeting those symptoms were initiated. He was medicated & discharged on; Gabapentin 300 mg for agitation/pain management, Duloxetine 40 mg for depression & Trazodone 50 mg for insomnia. His other pre-existing medical problems were identified & his home medications resumed. Devon Harris was enrolled & participated in the group counseling sessions being offered & held on this unit. He learned coping skills..  During the course of his hospitalization, Izekiel's improvement was monitored by observation & his daily report of symptom  reduction noted. His emotional & mental status were monitored by daily self-inventory reports completed by Devon Favre & the clinical staff. He was evaluated by the treatment team for mood stability & plans for continued recovery after discharge. Devon Harris's motivation was an integral factor in his mood stability. He was offered further treatment options upon discharge as noted  below.   Upon discharge, Devon Harris was both mentally & medically stable. He denies suicidal/homicidal ideations, auditory/visual/tactile hallucinations, delusional thoughts or paranoia. He left [facility] with all belongings in no distress. Transportation per daughter.  Physical Findings:  AIMS: Facial and Oral Movements Muscles of Facial Expression: None, normal Lips and Perioral Area: None, normal Jaw: None, normal Tongue: None, normal,Extremity Movements Upper (arms, wrists, hands, fingers): None, normal Lower (legs, knees, ankles, toes): None, normal, Trunk Movements Neck, shoulders, hips: None, normal, Overall Severity Severity of abnormal movements (highest score from questions above): None, normal Incapacitation due to abnormal movements: None, normal Patient's awareness of abnormal movements (rate only patient's report): No Awareness, Dental Status Current problems with teeth and/or dentures?: No Does patient usually wear dentures?: No  CIWA:    COWS:     Musculoskeletal: Strength & Muscle Tone: within normal limits Gait & Station: normal Patient leans: N/A  Psychiatric Specialty Exam: Physical Exam  ROS  Blood pressure (!) 134/92, pulse 84, temperature 97.8 F (36.6 C), temperature source Oral, resp. rate 16, height 5\' 6"  (1.676 m), weight 98 kg (216 lb), SpO2 99 %.Body mass index is 34.86 kg/m.     Have you used any form of tobacco in the last 30 days? (Cigarettes, Smokeless Tobacco, Cigars, and/or Pipes): No  Has this patient used any form of tobacco in the last 30 days? (Cigarettes, Smokeless Tobacco, Cigars, and/or Pipes): No  Blood Alcohol level:  Lab Results  Component Value Date   ETH <5 12/31/2015    Metabolic Disorder Labs:  Lab Results  Component Value Date   HGBA1C 6.2 (H) 01/02/2016   MPG 131 01/02/2016   No results found for: PROLACTIN No results found for: CHOL, TRIG, HDL, CHOLHDL, VLDL, LDLCALC  See Psychiatric Specialty Exam and Suicide Risk  Assessment completed by Attending Physician prior to discharge.  Discharge destination:  Home  Is patient on multiple antipsychotic therapies at discharge:  No   Has Patient had three or more failed trials of antipsychotic monotherapy by history:  No  Recommended Plan for Multiple Antipsychotic Therapies: NA    Medication List    STOP taking these medications   acetaminophen 500 MG tablet Commonly known as:  TYLENOL   allopurinol 300 MG tablet Commonly known as:  ZYLOPRIM   aspirin EC 81 MG tablet   cyanocobalamin 1000 MCG tablet   docusate sodium 100 MG capsule Commonly known as:  COLACE   oxyCODONE-acetaminophen 5-325 MG tablet Commonly known as:  PERCOCET/ROXICET   sertraline 100 MG tablet Commonly known as:  ZOLOFT     TAKE these medications     Indication  amLODipine 5 MG tablet Commonly known as:  NORVASC Take 1 tablet (5 mg total) by mouth daily. For high blood pressure What changed:  medication strength  how much to take  additional instructions  Indication:  High Blood Pressure Disorder   DULoxetine HCl 40 MG Cpep Take 40 mg by mouth daily. For depression  Indication:  Major Depressive Disorder   gabapentin 300 MG capsule Commonly known as:  NEURONTIN Take 1 capsule (300 mg total) by mouth 3 (three) times daily. For agitation What changed:  additional instructions  Indication:  Agitation   hydrOXYzine 25 MG tablet Commonly known as:  ATARAX/VISTARIL Take 1 tablet (25 mg total) by mouth every 6 (six) hours as needed for anxiety.  Indication:  Anxiety   lidocaine 5 % Commonly known as:  LIDODERM Place 1 patch onto the skin daily. Remove & Discard patch within 12 hours or as directed by MD: For pain management  Indication:  Pain management   methocarbamol 500 MG tablet Commonly known as:  ROBAXIN Take 1 tablet (500 mg total) by mouth 2 (two) times daily as needed for muscle spasms.  Indication:  Musculoskeletal Pain   traZODone 50 MG  tablet Commonly known as:  DESYREL Take 1 tablet (50 mg total) by mouth at bedtime as needed for sleep.  Indication:  Trouble Sleeping      Follow-up Information    [facility] Follow up on 01/17/2016.   Why:  Hospital discharge follow up appointment w Dr. Orson Aloe, your primary care doctor, on Sept 8th at 9 AM.  PCP is requesting consult for mental health services.  Per the [facility] scheduler, patient can also present to [facility] ED and request mental health consult. Contact information: [facility] [address] [place], [place] [address] [phone]  Fax: [phone]         Follow-up recommendations: Activity:  As tolerated Diet: As recommended by your primary care doctor. Keep all scheduled follow-up appointments as recommended.   Comments: Patient is instructed prior to discharge to: Take all medications as prescribed by his/her mental healthcare provider. Report any adverse effects and or reactions from the medicines to his/her outpatient provider promptly. Patient has been instructed & cautioned: To not engage in alcohol and or illegal drug use while on prescription medicines. In the event of worsening symptoms, patient is instructed to call the crisis hotline, 911 and or go to the nearest ED for appropriate evaluation and treatment of symptoms. To follow-up with his/her primary care provider for your other medical issues, concerns and or health care needs.   Signed: Sanjuana Kava, NP, PMHNP, FNP-BC 01/06/2016, 10:17 AM

## 2016-01-06 NOTE — Progress Notes (Signed)
Patient ID: Devon Harris, male   DOB: July 26, 1950, 65 y.o.   MRN: [id number]  Fall Note-  At 11 am patient sat in a chair in the search room with wheels on them. Patient sat before chair was stabilized and patient descended to the floor falling on his bottom and rolling to his back. At no time did patient's head hit the floor. Patient also denied that it did. He also reports not pain anywhere and no scratches, bruises, or open wounds noted. Patient able to perform range of motion without difficulty or pain while performing. Writer instructed patient to follow up with MD if these areas change. Charge Nurse Rayfield Citizenaroline, Sammuel CooperA/C Tina, and NP [facility] whom was in charge of his discharge reconciliation was notified. Vital signs were obtained, patient's BP was elevated however patient appeared in no distress and reports this has been WDL for [facility].

## 2016-01-06 NOTE — Progress Notes (Signed)
D: Patient seen interacting with peers on day hall. Appear cheerful and happy. Patient stated he's a little nervous going home tomorrow but looking forward towards that. Denies pain, SI, ah/vh at this time. No behavioral issues noted.  A: Staff encouraged patient to continue with the treatment plan and verbalize needs to staff. Due meds given as ordered. Every 15 minutes check for safety maintained. Will continue to monitor patient for safety and stability. R: Patient remains safe.  

## 2016-01-06 NOTE — Progress Notes (Signed)
   01/06/16 1100  Adult Fall Risk Assessment  Risk Factor Category (scoring not indicated) Fall has occurred during this admission (document High fall risk)  Patient's Fall Risk High Fall Risk (>13 points)  Screening for Fall Injury Risk  Risk For Fall Injury- See Row Information  Nurse judgement  Adult Fall Risk Interventions  Required Bundle Interventions *See Row Information* High fall risk - low, moderate, and high requirements implemented  Safe Patient Handling Required Documentation-Repositioning Needs  Assist with movement in bed No  Fragile skin with pressure ulcer No  Unresponsive No  Safe Patient Handling Assessment  Ambulates independently Yes, no lift needed

## 2016-01-06 NOTE — Tx Team (Signed)
Interdisciplinary Treatment and Diagnostic Plan Update  01/06/2016 Time of Session: 8:07 AM  MUREL SHENBERGER MRN: [id number]  Principal Diagnosis: Major Depressive Disorder, severe, with psychotic features  Secondary Diagnoses: Active Problems:   Major depressive disorder, recurrent episode, severe, with psychosis (HCC)   Current Medications:  Current Facility-Administered Medications  Medication Dose Route Frequency Provider Last Rate Last Dose  . alum & mag hydroxide-simeth (MAALOX/MYLANTA) 200-200-20 MG/5ML suspension 30 mL  30 mL Oral Q4H PRN Charm Rings, NP      . amLODipine (NORVASC) tablet 5 mg  5 mg Oral Daily Craige Cotta, MD   5 mg at 01/06/16 0825  . DULoxetine (CYMBALTA) DR capsule 40 mg  40 mg Oral Daily Craige Cotta, MD   40 mg at 01/06/16 0825  . gabapentin (NEURONTIN) capsule 300 mg  300 mg Oral TID Craige Cotta, MD   300 mg at 01/06/16 0825  . hydrOXYzine (ATARAX/VISTARIL) tablet 25 mg  25 mg Oral Q6H PRN Kerry Hough, PA-C   25 mg at 01/05/16 2140  . ibuprofen (ADVIL,MOTRIN) tablet 600 mg  600 mg Oral Q8H PRN Craige Cotta, MD   600 mg at 01/04/16 2110  . lidocaine (LIDODERM) 5 % 1 patch  1 patch Transdermal Daily Craige Cotta, MD   1 patch at 01/06/16 0825  . magnesium hydroxide (MILK OF MAGNESIA) suspension 30 mL  30 mL Oral Daily PRN Charm Rings, NP      . methocarbamol (ROBAXIN) tablet 500 mg  500 mg Oral Q8H PRN Craige Cotta, MD   500 mg at 01/04/16 2110  . traZODone (DESYREL) tablet 50 mg  50 mg Oral QHS PRN Craige Cotta, MD   50 mg at 01/05/16 2140   PTA Medications: Prescriptions Prior to Admission  Medication Sig Dispense Refill Last Dose  . allopurinol (ZYLOPRIM) 300 MG tablet Take 300 mg by mouth daily.     Marland Kitchen amLODipine (NORVASC) 10 MG tablet Take 10 mg by mouth daily.     Marland Kitchen aspirin EC 81 MG tablet Take 81 mg by mouth daily with breakfast.     . cyanocobalamin 1000 MCG tablet Take 1,000 mcg by mouth daily.     Marland Kitchen docusate  sodium (COLACE) 100 MG capsule Take 200 mg by mouth at bedtime.     . gabapentin (NEURONTIN) 300 MG capsule Take 300 mg by mouth 3 (three) times daily.     . sertraline (ZOLOFT) 100 MG tablet Take 150 mg by mouth daily.     Marland Kitchen acetaminophen (TYLENOL) 500 MG tablet Take 1 tablet (500 mg total) by mouth every 6 (six) hours as needed. 30 tablet 0   . methocarbamol (ROBAXIN) 500 MG tablet Take 1 tablet (500 mg total) by mouth 2 (two) times daily as needed for muscle spasms. (Patient not taking: Reported on 01/02/2016) 20 tablet 0 Not Taking at Unknown time  . oxyCODONE-acetaminophen (PERCOCET/ROXICET) 5-325 MG per tablet 1 to 2 tabs PO q6hrs  PRN for pain (Patient not taking: Reported on 01/02/2016) 15 tablet 0 Not Taking at Unknown time    Treatment Modalities: Medication Management, Group therapy, Case management,  1 to 1 session with clinician, Psychoeducation, Recreational therapy.   Physician Treatment Plan for Primary Diagnosis: Major Depressive Disorder, severe, with psychotic features Long Term Goal(s): Improvement in symptoms so as ready for discharge   Short Term Goals: Ability to verbalize feelings will improve, Ability to disclose and discuss suicidal ideas, Ability to identify  and develop effective coping behaviors will improve and Ability to identify triggers associated with substance abuse/mental health issues will improve  Medication Management: Evaluate patient's response, side effects, and tolerance of medication regimen.  Therapeutic Interventions: 1 to 1 sessions, Unit Group sessions and Medication administration.  Evaluation of Outcomes: Adequate for Discharge  Physician Treatment Plan for Secondary Diagnosis: Active Problems:   Major depressive disorder, recurrent episode, severe, with psychosis (HCC)  Long Term Goal(s): Improvement in symptoms so as ready for discharge  Short Term Goals: Ability to disclose and discuss suicidal ideas and Ability to identify triggers  associated with substance abuse/mental health issues will improve  Medication Management: Evaluate patient's response, side effects, and tolerance of medication regimen.  Therapeutic Interventions: 1 to 1 sessions, Unit Group sessions and Medication administration.  Evaluation of Outcomes: Adequate for Discharge   RN Treatment Plan for Primary Diagnosis: <principal problem not specified> Long Term Goal(s): Knowledge of disease and therapeutic regimen to maintain health will improve  Short Term Goals: Ability to verbalize frustration and anger appropriately will improve, Ability to verbalize feelings will improve, Ability to disclose and discuss suicidal ideas and Ability to identify and develop effective coping behaviors will improve  Medication Management: RN will administer medications as ordered by provider, will assess and evaluate patient's response and provide education to patient for prescribed medication. RN will report any adverse and/or side effects to prescribing provider.  Therapeutic Interventions: 1 on 1 counseling sessions, Psychoeducation, Medication administration, Evaluate responses to treatment, Monitor vital signs and CBGs as ordered, Perform/monitor CIWA, COWS, AIMS and Fall Risk screenings as ordered, Perform wound care treatments as ordered.  Evaluation of Outcomes: Adequate for Discharge   LCSW Treatment Plan for Primary Diagnosis: MDD Long Term Goal(s): Safe transition to appropriate next level of care at discharge, Engage patient in therapeutic group addressing interpersonal concerns.  Short Term Goals: Engage patient in aftercare planning with referrals and resources, Increase ability to appropriately verbalize feelings, Facilitate patient progression through stages of change regarding substance use diagnoses and concerns and Identify triggers associated with mental health/substance abuse issues  Therapeutic Interventions: Assess for all discharge needs, 1 to 1  time with Social worker, Explore available resources and support systems, Assess for adequacy in community support network, Educate family and significant other(s) on suicide prevention, Complete Psychosocial Assessment, Interpersonal group therapy.  Evaluation of Outcomes: Adequate for Discharge  Progress in Treatment: Attending groups: No  Participating in groups: No Taking medication as prescribed: Yes, MD continuing to assess for appropriate medication regimen Toleration medication: Yes Family/Significant other contact made: yes with sister Patient understands diagnosis: Yes AEB seeking help with depression Discussing patient identified problems/goals with staff: Yes Medical problems stabilized or resolved: Yes Denies suicidal/homicidal ideation: Yes Issues/concerns per patient self-inventory: None reported Other: N/A  New problem(s) identified: None reported at this time  New Short Term/Long Term Goal(s): None at this time  Discharge Plan or Barriers:  [place] [facility]. He is under the impression that he will be approved for a new apartment while he is in the hospital .  01/06/2016: [place] [facility] [place].   Reason for Continuation of Hospitalization:  Depression Hallucinations Medication stabilization Suicidal ideation  Estimated Length of Stay: 0 days  Attendees: Patient:   Physician: Dr. Hilaria OtaLekwuawa, MD 01/06/2016 8:07 AM  Nursing: Marzetta Boardhrista Dopson, RN; Joslyn Devonaroline Beaudry, RN 01/06/2016 8:07 AM  RN Care Manager: Onnie BoerJennifer Clark 01/06/2016 8:07 AM  Social Worker: Chad Cordial, LCSW 01/06/2016 8:07 AM  Social Worker: Santa Genera, LCSW 01/06/2016 8:07 AM  Other: Gray Bernhardt, NP; Armandina Stammer, NP 01/06/2016 8:07 AM  Other:  01/06/2016 8:07 AM  Other: 01/06/2016 8:07 AM    Scribe for Treatment Team: Santa Genera, LCSW 01/06/2016 8:07 AM

## 2016-01-06 NOTE — Progress Notes (Signed)
Patient ID: Devon MeierOscar L Harris, male   DOB: 22-Jun-1950, 65 y.o.   MRN: [id number]  Pt. Denies SI/HI and A/V hallucinations. Belongings returned to patient at time of discharge. Patient denies any pain or discomfort. Discharge instructions and medications were reviewed with patient. Patient verbalized understanding of both medications and discharge instructions. Patient discharged to lobby where his family member was there to pick him up. Q15 minute safety checks maintained until discharge. No distress upon patient discharge. He reports [facility].

## 2016-01-06 NOTE — Progress Notes (Signed)
Patient ID: Devon Harris, male   DOB: 01/28/1951, 65 y.o.   MRN: [id number] PER STATE REGULATIONS 482.30  THIS CHART WAS REVIEWED FOR MEDICAL NECESSITY WITH RESPECT TO THE PATIENT'S ADMISSION/ DURATION OF STAY.  NEXT REVIEW DATE: 01/08/2016  Willa RoughJENNIFER JONES [place], RN, BSN CASE MANAGER

## 2016-01-06 NOTE — Progress Notes (Signed)
  [facility] Adult Case Management Discharge Plan :  Will you be returning to the same living situation after discharge:  No, Pt plans to stay with family temporarily At discharge, do you have transportation home?: Yes,  Pt daughter to provide transportation Do you have the ability to pay for your medications: Yes,  Pt provided with prescriptions  Release of information consent forms completed and in the chart;  Patient's signature needed at discharge.  Patient to Follow up at: Follow-up Information    [facility].   Why:  Hospital discharge follow up appointment w Dr. Orson AloeHenderson, your primary care doctor, on Sept 8th at 9 AM.  PCP is requesting consult for mental health services.  [facility], [facility] and [facility] health consult. Contact information: [facility] [address] [place], [place] [address] [phone]  Fax: [phone]          Next level of care provider [facility] Link:no  Safety Planning and Suicide Prevention discussed: Yes,  with sister; see SPE note  Have you used any form of tobacco in the last 30 days? (Cigarettes, Smokeless Tobacco, Cigars, and/or Pipes): No  Has patient been referred to the Quitline?: N/A patient is not a smoker  Patient has been referred for addiction treatment: Yes  Manie Bealer Lavonna RuaM Carter 01/06/2016, 11:52 AM

## 2016-01-06 NOTE — Progress Notes (Signed)
Recreation Therapy Notes  Date: 01/06/16 Time: 0930 Location: 300 [address] Dayroom  Group Topic: Stress Management  Goal Area(s) Addresses:  Patient will verbalize importance of using healthy stress management.  Patient will identify positive emotions associated with healthy stress management.   Intervention: Stress Management  Activity :  [place].  LRT introduced to the technique of guided imagery to the patients.  Patients were to follow along as LRT read script in order to participate in the the technique.  Education: Stress Management, Discharge Planning.   Education Outcome: Acknowledges edcuation/In group clarification offered/Needs additional education  Clinical Observations/Feedback: Pt did not attend group.    Devon Harris, LRT/CTRS     Devon Harris 01/06/2016 12:53 PM 
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
## 2016-06-25 ENCOUNTER — Inpatient Hospital Stay (HOSPITAL_COMMUNITY)
Admission: EM | Admit: 2016-06-25 | Discharge: 2016-06-27 | DRG: 918 | Discharge status: Against Medical Advice | Payer: Medicare Other | Attending: Internal Medicine | Admitting: Internal Medicine

## 2016-06-25 ENCOUNTER — Encounter (HOSPITAL_COMMUNITY): Payer: Self-pay | Admitting: Emergency Medicine

## 2016-06-25 ENCOUNTER — Emergency Department (HOSPITAL_COMMUNITY): Payer: Medicare Other

## 2016-06-25 DIAGNOSIS — R0902 Hypoxemia: Secondary | ICD-10-CM | POA: Diagnosis present

## 2016-06-25 DIAGNOSIS — Z8673 Personal history of transient ischemic attack (TIA), and cerebral infarction without residual deficits: Secondary | ICD-10-CM

## 2016-06-25 DIAGNOSIS — I952 Hypotension due to drugs: Secondary | ICD-10-CM | POA: Diagnosis present

## 2016-06-25 DIAGNOSIS — F1910 Other psychoactive substance abuse, uncomplicated: Secondary | ICD-10-CM

## 2016-06-25 DIAGNOSIS — F1110 Opioid abuse, uncomplicated: Secondary | ICD-10-CM | POA: Diagnosis present

## 2016-06-25 DIAGNOSIS — Z79899 Other long term (current) drug therapy: Secondary | ICD-10-CM

## 2016-06-25 DIAGNOSIS — Y92009 Unspecified place in unspecified non-institutional (private) residence as the place of occurrence of the external cause: Secondary | ICD-10-CM

## 2016-06-25 DIAGNOSIS — E876 Hypokalemia: Secondary | ICD-10-CM | POA: Diagnosis present

## 2016-06-25 DIAGNOSIS — Z8249 Family history of ischemic heart disease and other diseases of the circulatory system: Secondary | ICD-10-CM

## 2016-06-25 DIAGNOSIS — T401X1A Poisoning by heroin, accidental (unintentional), initial encounter: Secondary | ICD-10-CM | POA: Diagnosis not present

## 2016-06-25 DIAGNOSIS — N179 Acute kidney failure, unspecified: Secondary | ICD-10-CM | POA: Diagnosis present

## 2016-06-25 DIAGNOSIS — I10 Essential (primary) hypertension: Secondary | ICD-10-CM | POA: Diagnosis present

## 2016-06-25 DIAGNOSIS — F1410 Cocaine abuse, uncomplicated: Secondary | ICD-10-CM | POA: Diagnosis present

## 2016-06-25 DIAGNOSIS — F32A Depression, unspecified: Secondary | ICD-10-CM | POA: Diagnosis present

## 2016-06-25 DIAGNOSIS — F4310 Post-traumatic stress disorder, unspecified: Secondary | ICD-10-CM | POA: Diagnosis present

## 2016-06-25 DIAGNOSIS — E785 Hyperlipidemia, unspecified: Secondary | ICD-10-CM | POA: Diagnosis present

## 2016-06-25 DIAGNOSIS — F329 Major depressive disorder, single episode, unspecified: Secondary | ICD-10-CM | POA: Diagnosis present

## 2016-06-25 DIAGNOSIS — R4182 Altered mental status, unspecified: Secondary | ICD-10-CM | POA: Diagnosis present

## 2016-06-25 DIAGNOSIS — F339 Major depressive disorder, recurrent, unspecified: Secondary | ICD-10-CM | POA: Diagnosis present

## 2016-06-25 HISTORY — DX: Other psychoactive substance dependence, uncomplicated: F19.20

## 2016-06-25 LAB — SALICYLATE LEVEL

## 2016-06-25 LAB — CBC WITH DIFFERENTIAL/PLATELET
BASOS PCT: 0 %
Basophils Absolute: 0 10*3/uL (ref 0.0–0.1)
EOS ABS: 0 10*3/uL (ref 0.0–0.7)
EOS PCT: 0 %
HCT: 47.5 % (ref 39.0–52.0)
Hemoglobin: 15.6 g/dL (ref 13.0–17.0)
LYMPHS PCT: 14 %
Lymphs Abs: 1.1 10*3/uL (ref 0.7–4.0)
MCH: 28.4 pg (ref 26.0–34.0)
MCHC: 32.8 g/dL (ref 30.0–36.0)
MCV: 86.4 fL (ref 78.0–100.0)
Monocytes Absolute: 0.4 10*3/uL (ref 0.1–1.0)
Monocytes Relative: 5 %
Neutro Abs: 6.5 10*3/uL (ref 1.7–7.7)
Neutrophils Relative %: 81 %
PLATELETS: 326 10*3/uL (ref 150–400)
RBC: 5.5 MIL/uL (ref 4.22–5.81)
RDW: 13.9 % (ref 11.5–15.5)
WBC: 8.1 10*3/uL (ref 4.0–10.5)

## 2016-06-25 LAB — RAPID URINE DRUG SCREEN, HOSP PERFORMED
AMPHETAMINES: NOT DETECTED
BENZODIAZEPINES: NOT DETECTED
Barbiturates: NOT DETECTED
Cocaine: POSITIVE — AB
OPIATES: POSITIVE — AB
Tetrahydrocannabinol: NOT DETECTED

## 2016-06-25 LAB — COMPREHENSIVE METABOLIC PANEL
ALBUMIN: 4 g/dL (ref 3.5–5.0)
ALT: 22 U/L (ref 17–63)
AST: 19 U/L (ref 15–41)
Alkaline Phosphatase: 82 U/L (ref 38–126)
Anion gap: 12 (ref 5–15)
BUN: 11 mg/dL (ref 6–20)
CHLORIDE: 100 mmol/L — AB (ref 101–111)
CO2: 26 mmol/L (ref 22–32)
CREATININE: 1.73 mg/dL — AB (ref 0.61–1.24)
Calcium: 8.8 mg/dL — ABNORMAL LOW (ref 8.9–10.3)
GFR calc Af Amer: 46 mL/min — ABNORMAL LOW (ref >60)
GFR calc non Af Amer: 39 mL/min — ABNORMAL LOW (ref >60)
Glucose, Bld: 104 mg/dL — ABNORMAL HIGH (ref 65–99)
POTASSIUM: 3.5 mmol/L (ref 3.5–5.1)
Sodium: 138 mmol/L (ref 135–145)
Total Bilirubin: 0.5 mg/dL (ref 0.3–1.2)
Total Protein: 7.6 g/dL (ref 6.5–8.1)

## 2016-06-25 LAB — ACETAMINOPHEN LEVEL

## 2016-06-25 LAB — I-STAT TROPONIN, ED: Troponin i, poc: 0.02 ng/mL (ref 0.00–0.08)

## 2016-06-25 LAB — ETHANOL

## 2016-06-25 LAB — BRAIN NATRIURETIC PEPTIDE: B Natriuretic Peptide: 5.7 pg/mL (ref 0.0–100.0)

## 2016-06-25 MED ORDER — SODIUM CHLORIDE 0.9 % IV BOLUS (SEPSIS)
500.0000 mL | Freq: Once | INTRAVENOUS | Status: AC
  Administered 2016-06-25: 500 mL via INTRAVENOUS

## 2016-06-25 NOTE — ED Triage Notes (Signed)
Per EMS, patient overdosed on heroin.  Daughter says he takes whatever he can get his hands on.  Been IVCd  And in Rehab previously.  Neighbors found him collapsed between bed and dresser.  Unable to move right leg when awoken with narcan.  Given 2 of narcan by EMS.  NPA and bagged at seen.  Once given narcan, woke up within two minutes.  18G LAC.  95 % RA, A/O x 2, ST EKG unremarkable.  140/100, 171 CBG, 110 HR, RR 16-18.

## 2016-06-25 NOTE — ED Notes (Signed)
Pt keeps bending arm so bolus is not going in very fast.  Patient advised to keep arm straight.

## 2016-06-25 NOTE — ED Notes (Signed)
In XR

## 2016-06-25 NOTE — ED Notes (Signed)
Daughter of patient called wanting to have pt IVCd.  I told her that since he is here, it is up to the doctor's to have patient IVCd.  I told her that I would give EDP her number and he will call her for more information.

## 2016-06-25 NOTE — ED Notes (Signed)
Per EDP, placed patient on 2L of O2 due to Oxygen dropping to 81% RA while sleeping.

## 2016-06-25 NOTE — ED Provider Notes (Signed)
[facility]-EMERGENCY DEPT Provider Note   CSN: [id number] Arrival date & time: 06/25/16  1835     History   Chief Complaint Chief Complaint  Patient presents with  . Drug Overdose    HPI Devon Harris is a 66 y.o. male.  HPI Patient has history of depression and drug abuse. Found unresponsive by family. EMS called. Patient was breathing 4 times a minute. Given Narcan en route with improvement of mental statusAnd spontaneous respirations. Patient denies any suicidal ideation or intentional overdose. States he's had flulike symptoms last few days including cough. Denies nausea or vomiting. Denies chest pain or abdominal pain. Past Medical History:  Diagnosis Date  . Drug abuse and dependence (HCC)   . Gout   . Hypertension     Patient Active Problem List   Diagnosis Date Noted  . AKI (acute kidney injury) (HCC) 06/25/2016  . Major depressive disorder, recurrent severe without psychotic features (HCC) 12/31/2015  . Major depressive disorder, recurrent episode, severe, with psychosis (HCC) 12/31/2015    Past Surgical History:  Procedure Laterality Date  . BACK SURGERY         Home Medications    Prior to Admission medications   Medication Sig Start Date End Date Taking? Authorizing Provider  amLODipine (NORVASC) 5 MG tablet Take 1 tablet (5 mg total) by mouth daily. For high blood pressure 01/06/16   Sanjuana Kava, NP  DULoxetine 40 MG CPEP Take 40 mg by mouth daily. For depression 01/06/16   Sanjuana Kava, NP  gabapentin (NEURONTIN) 300 MG capsule Take 1 capsule (300 mg total) by mouth 3 (three) times daily. For agitation 01/06/16   Sanjuana Kava, NP  hydrOXYzine (ATARAX/VISTARIL) 25 MG tablet Take 1 tablet (25 mg total) by mouth every 6 (six) hours as needed for anxiety. 01/06/16   Sanjuana Kava, NP  lidocaine (LIDODERM) 5 % Place 1 patch onto the skin daily. Remove & Discard patch within 12 hours or as directed by MD: For pain management 01/06/16   Sanjuana Kava, NP    methocarbamol (ROBAXIN) 500 MG tablet Take 1 tablet (500 mg total) by mouth 2 (two) times daily as needed for muscle spasms. 01/06/16   Sanjuana Kava, NP  traZODone (DESYREL) 50 MG tablet Take 1 tablet (50 mg total) by mouth at bedtime as needed for sleep. 01/06/16   Sanjuana Kava, NP    Family History History reviewed. No pertinent family history.  Social History Social History  Substance Use Topics  . Smoking status: Never Smoker  . Smokeless tobacco: Never Used  . Alcohol use Yes     Allergies   Patient has no known allergies.   Review of Systems Review of Systems  Constitutional: Negative for chills and fever.  HENT: Positive for congestion, rhinorrhea and sore throat.   Respiratory: Positive for cough. Negative for wheezing.   Cardiovascular: Negative for chest pain and palpitations.  Gastrointestinal: Negative for abdominal pain, diarrhea, nausea and vomiting.  Genitourinary: Negative for dysuria and frequency.  Musculoskeletal: Negative for back pain, neck pain and neck stiffness.  Skin: Negative for rash and wound.  Neurological: Negative for dizziness, weakness, light-headedness, numbness and headaches.  Psychiatric/Behavioral: Negative for suicidal ideas.  All other systems reviewed and are negative.    Physical Exam Updated Vital Signs BP 143/93   Pulse 76   Temp 98.3 F (36.8 C) (Oral)   Resp 13   SpO2 97%   Physical Exam  Constitutional: He is oriented  to person, place, and time. He appears well-developed and well-nourished. No distress.  HENT:  Head: Normocephalic and atraumatic.  Mouth/Throat: Oropharynx is clear and moist. No oropharyngeal exudate.  Eyes: EOM are normal. Pupils are equal, round, and reactive to light.  Pupils are pinpoint bilaterally.  Neck: Normal range of motion. Neck supple. No JVD present.  Cardiovascular: Normal rate and regular rhythm.  Exam reveals no gallop and no friction rub.   No murmur heard. Pulmonary/Chest: Effort  normal. He has rales.  Scattered rhonchi  Abdominal: Soft. Bowel sounds are normal. There is no tenderness. There is no rebound and no guarding.  Musculoskeletal: Normal range of motion. He exhibits no edema or tenderness.  No lower extremity swelling or asymmetry.  Neurological: He is alert and oriented to person, place, and time.  Mildly confused. 5/5 motor in all extremities. Sensation fully intact.  Skin: Skin is warm and dry. Capillary refill takes less than 2 seconds. No rash noted. He is not diaphoretic. No erythema.  Psychiatric:  Denies HI/SI.  Nursing note and vitals reviewed.    ED Treatments / Results  Labs (all labs ordered are listed, but only abnormal results are displayed) Labs Reviewed  COMPREHENSIVE METABOLIC PANEL - Abnormal; Notable for the following:       Result Value   Chloride 100 (*)    Glucose, Bld 104 (*)    Creatinine, Ser 1.73 (*)    Calcium 8.8 (*)    GFR calc non Af Amer 39 (*)    GFR calc Af Amer 46 (*)    All other components within normal limits  ACETAMINOPHEN LEVEL - Abnormal; Notable for the following:    Acetaminophen (Tylenol), Serum <10 (*)    All other components within normal limits  RAPID URINE DRUG SCREEN, HOSP PERFORMED - Abnormal; Notable for the following:    Opiates POSITIVE (*)    Cocaine POSITIVE (*)    All other components within normal limits  CBC WITH DIFFERENTIAL/PLATELET  BRAIN NATRIURETIC PEPTIDE  SALICYLATE LEVEL  ETHANOL  CK  MAGNESIUM  I-STAT TROPOININ, ED    EKG  EKG Interpretation None       Radiology Dg Chest 2 View  Result Date: 06/25/2016 CLINICAL DATA:  Cough and congestion x4 days EXAM: CHEST  2 VIEW COMPARISON:  None. FINDINGS: Heart is borderline enlarged. The thoracic aorta is uncoiled and tortuous in appearance without aneurysm. Lungs are free of pneumonic consolidations, effusions or pneumothoraces. No pulmonary edema. ACDF of the lower cervical spine. No acute osseous appearing abnormality.  IMPRESSION: No active cardiopulmonary disease. Electronically Signed   By: Tollie Ethavid  Kwon M.D.   On: 06/25/2016 19:43   Ct Head Wo Contrast  Result Date: 06/25/2016 CLINICAL DATA:  Acute onset of altered mental status. Initial encounter. EXAM: CT HEAD WITHOUT CONTRAST TECHNIQUE: Contiguous axial images were obtained from the base of the skull through the vertex without intravenous contrast. COMPARISON:  CT of the cervical spine performed 08/17/2014 FINDINGS: Brain: No evidence of acute infarction, hemorrhage, hydrocephalus, extra-axial collection or mass lesion/mass effect. Prominence of the ventricles and sulci reflects mild cortical volume loss. Scattered periventricular and subcortical white matter change likely reflects small vessel ischemic microangiopathy. Chronic lacunar infarcts are seen at the basal ganglia bilaterally, and at the right thalamus. A cavum septum pellucidum is noted. The brainstem and fourth ventricle are within normal limits. The cerebral hemispheres demonstrate grossly normal gray-white differentiation. No mass effect or midline shift is seen. Vascular: No hyperdense vessel or unexpected calcification.  Skull: There is no evidence of fracture; visualized osseous structures are unremarkable in appearance. Sinuses/Orbits: The orbits are within normal limits. Mucosal thickening is noted at the maxillary sinuses bilaterally. The remaining paranasal sinuses and mastoid air cells are well-aerated. Other: No significant soft tissue abnormalities are seen. IMPRESSION: 1. No acute intracranial pathology seen on CT. 2. Mild cortical volume loss and scattered small vessel ischemic microangiopathy. 3. Chronic lacunar infarcts at the basal ganglia bilaterally, and at the right thalamus. 4. Mucosal thickening at the maxillary sinuses bilaterally. Electronically Signed   By: Roanna Raider M.D.   On: 06/25/2016 21:54    Procedures Procedures (including critical care time)  Medications Ordered in  ED Medications  sodium chloride 0.9 % bolus 500 mL (500 mLs Intravenous New Bag/Given 06/25/16 2136)     Initial Impression / Assessment and Plan / ED Course  I have reviewed the triage vital signs and the nursing notes.  Pertinent labs & imaging results that were available during my care of the patient were reviewed by me and considered in my medical decision making (see chart for details).    Spoke with patient's daughter, Devon Harris [phone]). States patient has history of drug abuse including narcotics and cocaine. He has been calling her for the past few days asking for money. He's made no suicidal statements. He has had cold-like symptoms and been treating them with over-the-counter medication.  Pt with several episodes of hypoxia. Placed on 2 L O2. Chest xray without acute findings. CT head with no acute findings. Vital signs are stable. Likely due to narcotics and sleep apnea. Patient also may have aspirated. Mild elevation in creatinine likely due to dehydration. Discussed with hospitalist and we'll admit for observation. Patient is cooperative with observation. Continues to deny suicidal ideation or intent.  Final Clinical Impressions(s) / ED Diagnoses   Final diagnoses:  Polysubstance abuse  Hypoxia    New Prescriptions New Prescriptions   No medications on file     Loren Racer, MD 06/25/16 2340

## 2016-06-25 NOTE — H&P (Signed)
History and Physical    ARSH FEUTZ QIH:[id number] DOB: 1950-09-28 DOA: 06/25/2016  PCP: [facility]   Patient coming from: Home.  Chief Complaint: AMS.  HPI: Devon Harris is a 66 y.o. male with medical history significant of drug abuse and dependence, gout, hypertension, depression was brought to the emergency department via EMS after he was found unresponsive at home by a neighbor.   Apparently, the patient overdosed on heroin. His neighbors/family found him between his bed and dresser. When seen by EMS he was only breathing after 4 RPM. He was given 2 mg of Narcan by EMS and was Ambu bagged with positive response. EMS reported that the patient woke up after 2 minutes and his mental status was improved. His daughter stated that the patient has a history of polysubstance abuse, has been admitted for substance rehabilitation and for depression before. He denies suicidal ideations. When asked, the patient denied using recreational drugs.  ED Course: The patient was given a 500 mL normal saline bolus. His WBC was 8.1, hemoglobin 15.6 and platelets 326. Alcohol, aspirin and acetaminophen level were normal. UDS was positive for cocaine and opiates. His sodium was 138, potassium 3.5, chloride 100 and bicarbonate 26 mmol/liter. His BUN was 11, creatinine 1.73 and glucose 104 mg/dL. His chest radiograph did not show any acute cardiopulmonary pathology. 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 Please see radiology report for further details.  Review of Systems: As per HPI otherwise 10 point review of systems negative.    Past Medical History:  Diagnosis Date  . Drug abuse and dependence (HCC)   . Gout   . Hypertension     Past Surgical History:  Procedure Laterality Date  . BACK SURGERY       reports that he has never smoked. He has never used smokeless tobacco. He reports that he drinks alcohol. He reports that he does  not use drugs.  No Known Allergies  Family history. Mother hypertension.   Prior to Admission medications   Medication Sig Start Date End Date Taking? Authorizing Provider  amLODipine (NORVASC) 5 MG tablet Take 1 tablet (5 mg total) by mouth daily. For high blood pressure 01/06/16   Sanjuana Kava, NP  DULoxetine 40 MG CPEP Take 40 mg by mouth daily. For depression 01/06/16   Sanjuana Kava, NP  gabapentin (NEURONTIN) 300 MG capsule Take 1 capsule (300 mg total) by mouth 3 (three) times daily. For agitation 01/06/16   Sanjuana Kava, NP  hydrOXYzine (ATARAX/VISTARIL) 25 MG tablet Take 1 tablet (25 mg total) by mouth every 6 (six) hours as needed for anxiety. 01/06/16   Sanjuana Kava, NP  lidocaine (LIDODERM) 5 % Place 1 patch onto the skin daily. Remove & Discard patch within 12 hours or as directed by MD: For pain management 01/06/16   Sanjuana Kava, NP  methocarbamol (ROBAXIN) 500 MG tablet Take 1 tablet (500 mg total) by mouth 2 (two) times daily as needed for muscle spasms. 01/06/16   Sanjuana Kava, NP  traZODone (DESYREL) 50 MG tablet Take 1 tablet (50 mg total) by mouth at bedtime as needed for sleep. 01/06/16   Sanjuana Kava, NP    Physical Exam:  Constitutional: NAD, calm, comfortable Vitals:   06/25/16 1900 06/25/16 1915 06/25/16 2100 06/25/16 2130  BP: 136/94 119/83 (!) 141/105 143/93  Pulse: 103 102 82 76  Resp:   Marland Kitchen)  9 13  Temp:      TempSrc:      SpO2: (!) 89% 93% (!) 88% 97%   Eyes: PERRL, lids and conjunctivae normal ENMT: Mucous membranes are moist. Posterior pharynx clear of any exudate or lesions. Neck: normal, supple, no masses, no thyromegaly Respiratory: Mild rhonchi bilaterally, no wheezing, no crackles. Normal respiratory effort. No accessory muscle use.  Cardiovascular: Regular rate and rhythm, no murmurs / rubs / gallops. No extremity edema. 2+ pedal pulses. No carotid bruits.  Abdomen: no tenderness, no masses palpated. No hepatosplenomegaly. Bowel sounds positive.   Musculoskeletal: No clubbing / cyanosis. Good ROM, no contractures. Normal muscle tone.  Skin: no rashes, lesions, ulcers.  Neurologic: CN 2-12 grossly intact. Sensation intact, DTR normal. Strength 5/5 in all 4.  Psychiatric: Alert and oriented x 2Partially oriented to time and situation.    Labs on Admission: I have personally reviewed following labs and imaging studies  CBC:  Recent Labs Lab 06/25/16 1926  WBC 8.1  NEUTROABS 6.5  HGB 15.6  HCT 47.5  MCV 86.4  PLT 326   Basic Metabolic Panel:  Recent Labs Lab 06/25/16 1926  NA 138  K 3.5  CL 100*  CO2 26  GLUCOSE 104*  BUN 11  CREATININE 1.73*  CALCIUM 8.8*   GFR: CrCl cannot be calculated (Unknown ideal weight.). Liver Function Tests:  Recent Labs Lab 06/25/16 1926  AST 19  ALT 22  ALKPHOS 82  BILITOT 0.5  PROT 7.6  ALBUMIN 4.0   No results for input(s): LIPASE, AMYLASE in the last 168 hours. No results for input(s): AMMONIA in the last 168 hours. Coagulation Profile: No results for input(s): INR, PROTIME in the last 168 hours. Cardiac Enzymes: No results for input(s): CKTOTAL, CKMB, CKMBINDEX, TROPONINI in the last 168 hours. BNP (last 3 results) No results for input(s): PROBNP in the last 8760 hours. HbA1C: No results for input(s): HGBA1C in the last 72 hours. CBG: No results for input(s): GLUCAP in the last 168 hours. Lipid Profile: No results for input(s): CHOL, HDL, LDLCALC, TRIG, CHOLHDL, LDLDIRECT in the last 72 hours. Thyroid Function Tests: No results for input(s): TSH, T4TOTAL, FREET4, T3FREE, THYROIDAB in the last 72 hours. Anemia Panel: No results for input(s): VITAMINB12, FOLATE, FERRITIN, TIBC, IRON, RETICCTPCT in the last 72 hours. Urine analysis: No results found for: COLORURINE, APPEARANCEUR, LABSPEC, PHURINE, GLUCOSEU, HGBUR, BILIRUBINUR, KETONESUR, PROTEINUR, UROBILINOGEN, NITRITE, LEUKOCYTESUR  Radiological Exams on Admission: Dg Chest 2 View  Result Date:  06/25/2016 CLINICAL DATA:  Cough and congestion x4 days EXAM: CHEST  2 VIEW COMPARISON:  None. FINDINGS: Heart is borderline enlarged. The thoracic aorta is uncoiled and tortuous in appearance without aneurysm. Lungs are free of pneumonic consolidations, effusions or pneumothoraces. No pulmonary edema. ACDF of the lower cervical spine. No acute osseous appearing abnormality. IMPRESSION: No active cardiopulmonary disease. Electronically Signed   By: Tollie Ethavid  Kwon M.D.   On: 06/25/2016 19:43   Ct Head Wo Contrast  Result Date: 06/25/2016 CLINICAL DATA:  Acute onset of altered mental status. Initial encounter. EXAM: CT HEAD WITHOUT CONTRAST TECHNIQUE: Contiguous axial images were obtained from the base of the skull through the vertex without intravenous contrast. COMPARISON:  CT of the cervical spine performed 08/17/2014 FINDINGS: Brain: No evidence of acute infarction, hemorrhage, hydrocephalus, extra-axial collection or mass lesion/mass effect. Prominence of the ventricles and sulci reflects mild cortical volume loss. Scattered periventricular and subcortical white matter change likely reflects small vessel ischemic microangiopathy. Chronic lacunar infarcts are seen at  the basal ganglia bilaterally, and at the right thalamus. A cavum septum pellucidum is noted. The brainstem and fourth ventricle are within normal limits. The cerebral hemispheres demonstrate grossly normal gray-white differentiation. No mass effect or midline shift is seen. Vascular: No hyperdense vessel or unexpected calcification. Skull: There is no evidence of fracture; visualized osseous structures are unremarkable in appearance. Sinuses/Orbits: The orbits are within normal limits. Mucosal thickening is noted at the maxillary sinuses bilaterally. The remaining paranasal sinuses and mastoid air cells are well-aerated. Other: No significant soft tissue abnormalities are seen. IMPRESSION: 1. No acute intracranial pathology seen on CT. 2. Mild  cortical volume loss and scattered small vessel ischemic microangiopathy. 3. Chronic lacunar infarcts at the basal ganglia bilaterally, and at the right thalamus. 4. Mucosal thickening at the maxillary sinuses bilaterally. Electronically Signed   By: Roanna Raider M.D.   On: 06/25/2016 21:54    EKG: Independently reviewed.  Vent. rate 99 BPM PR interval * ms QRS duration 86 ms QT/QTc 334/429 ms P-R-T axes 28 36 251 Sinus tachycardia Ventricular premature complex Borderline prolonged PR interval Borderline repolarization abnormality No previous EKGs on record.  Assessment/Plan Principal Problem:   AKI (acute kidney injury) (HCC) Admit to telemetry/observation. Continue IV hydration. Check UA. Follow-up BUN, creatinine and electrolytes in a.m.  Active Problems:   Hyperlipidemia Not on medical treatment. Continue lifestyle modifications    Hypertension Continue amlodipine 5 mg by mouth daily. Monitor blood pressure.    Polysubstance abuse Social services evaluation in a.m. Consider psych evaluation.    Depression Continue fluoxetine 40 mg by mouth daily. Continue hydroxyzine 25 mg by mouth every 6 hours as needed for anxiety. Continue trazodone 50 mg by mouth at bedtime as needed.   DVT prophylaxis: Lovenox SQ. Code Status: Full code. Family Communication:  Disposition Plan: Admit for IV hydration and lab follow-up in the morning. Consults called:  Admission status: Observation/telemetry.   Bobette Mo MD [facility] Pager [phone].  If 7PM-7AM, please contact night-coverage [email or web address] Password [facility]  06/25/2016, 11:43 PM

## 2016-06-25 NOTE — ED Notes (Signed)
On way to CT 

## 2016-06-26 ENCOUNTER — Encounter (HOSPITAL_COMMUNITY): Payer: Self-pay | Admitting: Internal Medicine

## 2016-06-26 DIAGNOSIS — T401X1A Poisoning by heroin, accidental (unintentional), initial encounter: Secondary | ICD-10-CM | POA: Diagnosis present

## 2016-06-26 DIAGNOSIS — F339 Major depressive disorder, recurrent, unspecified: Secondary | ICD-10-CM | POA: Diagnosis present

## 2016-06-26 DIAGNOSIS — R4182 Altered mental status, unspecified: Secondary | ICD-10-CM | POA: Diagnosis present

## 2016-06-26 DIAGNOSIS — Y92009 Unspecified place in unspecified non-institutional (private) residence as the place of occurrence of the external cause: Secondary | ICD-10-CM | POA: Diagnosis not present

## 2016-06-26 DIAGNOSIS — F329 Major depressive disorder, single episode, unspecified: Secondary | ICD-10-CM

## 2016-06-26 DIAGNOSIS — R0902 Hypoxemia: Secondary | ICD-10-CM | POA: Diagnosis present

## 2016-06-26 DIAGNOSIS — E785 Hyperlipidemia, unspecified: Secondary | ICD-10-CM

## 2016-06-26 DIAGNOSIS — Z8673 Personal history of transient ischemic attack (TIA), and cerebral infarction without residual deficits: Secondary | ICD-10-CM | POA: Diagnosis not present

## 2016-06-26 DIAGNOSIS — F4310 Post-traumatic stress disorder, unspecified: Secondary | ICD-10-CM | POA: Diagnosis present

## 2016-06-26 DIAGNOSIS — I10 Essential (primary) hypertension: Secondary | ICD-10-CM

## 2016-06-26 DIAGNOSIS — F1410 Cocaine abuse, uncomplicated: Secondary | ICD-10-CM | POA: Diagnosis present

## 2016-06-26 DIAGNOSIS — E876 Hypokalemia: Secondary | ICD-10-CM | POA: Diagnosis present

## 2016-06-26 DIAGNOSIS — F1910 Other psychoactive substance abuse, uncomplicated: Secondary | ICD-10-CM | POA: Diagnosis not present

## 2016-06-26 DIAGNOSIS — Z8249 Family history of ischemic heart disease and other diseases of the circulatory system: Secondary | ICD-10-CM | POA: Diagnosis not present

## 2016-06-26 DIAGNOSIS — N179 Acute kidney failure, unspecified: Secondary | ICD-10-CM | POA: Diagnosis present

## 2016-06-26 DIAGNOSIS — Z79899 Other long term (current) drug therapy: Secondary | ICD-10-CM | POA: Diagnosis not present

## 2016-06-26 DIAGNOSIS — F1110 Opioid abuse, uncomplicated: Secondary | ICD-10-CM | POA: Diagnosis present

## 2016-06-26 DIAGNOSIS — I952 Hypotension due to drugs: Secondary | ICD-10-CM | POA: Diagnosis present

## 2016-06-26 LAB — BASIC METABOLIC PANEL
ANION GAP: 9 (ref 5–15)
BUN: 12 mg/dL (ref 6–20)
CO2: 26 mmol/L (ref 22–32)
Calcium: 8.4 mg/dL — ABNORMAL LOW (ref 8.9–10.3)
Chloride: 102 mmol/L (ref 101–111)
Creatinine, Ser: 1.36 mg/dL — ABNORMAL HIGH (ref 0.61–1.24)
GFR calc Af Amer: >60 mL/min (ref >60)
GFR calc non Af Amer: 53 mL/min — ABNORMAL LOW (ref >60)
GLUCOSE: 99 mg/dL (ref 65–99)
POTASSIUM: 3.3 mmol/L — AB (ref 3.5–5.1)
Sodium: 137 mmol/L (ref 135–145)

## 2016-06-26 LAB — CBC
HEMATOCRIT: 41.8 % (ref 39.0–52.0)
HEMOGLOBIN: 13.3 g/dL (ref 13.0–17.0)
MCH: 27.8 pg (ref 26.0–34.0)
MCHC: 31.8 g/dL (ref 30.0–36.0)
MCV: 87.3 fL (ref 78.0–100.0)
Platelets: 290 10*3/uL (ref 150–400)
RBC: 4.79 MIL/uL (ref 4.22–5.81)
RDW: 13.9 % (ref 11.5–15.5)
WBC: 7.9 10*3/uL (ref 4.0–10.5)

## 2016-06-26 LAB — MAGNESIUM: Magnesium: 2 mg/dL (ref 1.7–2.4)

## 2016-06-26 LAB — CK: Total CK: 243 U/L (ref 49–397)

## 2016-06-26 MED ORDER — ENSURE ENLIVE PO LIQD
237.0000 mL | Freq: Two times a day (BID) | ORAL | Status: DC
  Administered 2016-06-26 – 2016-06-27 (×4): 237 mL via ORAL

## 2016-06-26 MED ORDER — HYDROXYZINE HCL 25 MG PO TABS: 25.0000 mg | ORAL_TABLET | Freq: Four times a day (QID) | ORAL | Status: DC | PRN

## 2016-06-26 MED ORDER — ENOXAPARIN SODIUM 40 MG/0.4ML ~~LOC~~ SOLN
40.0000 mg | SUBCUTANEOUS | Status: DC
  Administered 2016-06-26 – 2016-06-27 (×2): 40 mg via SUBCUTANEOUS
  Filled 2016-06-26 (×2): qty 0.4

## 2016-06-26 MED ORDER — POTASSIUM CHLORIDE CRYS ER 20 MEQ PO TBCR
40.0000 meq | EXTENDED_RELEASE_TABLET | Freq: Once | ORAL | Status: AC
  Administered 2016-06-26: 40 meq via ORAL
  Filled 2016-06-26: qty 2

## 2016-06-26 MED ORDER — SODIUM CHLORIDE 0.9 % IV SOLN
INTRAVENOUS | Status: DC
  Administered 2016-06-26: 13:00:00 via INTRAVENOUS
  Administered 2016-06-26: 1000 mL via INTRAVENOUS
  Administered 2016-06-26: via INTRAVENOUS

## 2016-06-26 MED ORDER — TRAMADOL HCL 50 MG PO TABS: 50.0000 mg | ORAL_TABLET | Freq: Two times a day (BID) | ORAL | Status: DC | PRN

## 2016-06-26 MED ORDER — AMLODIPINE BESYLATE 5 MG PO TABS
5.0000 mg | ORAL_TABLET | Freq: Every day | ORAL | Status: DC
  Administered 2016-06-26 – 2016-06-27 (×2): 5 mg via ORAL
  Filled 2016-06-26 (×2): qty 1

## 2016-06-26 MED ORDER — ACETAMINOPHEN 325 MG PO TABS
650.0000 mg | ORAL_TABLET | Freq: Four times a day (QID) | ORAL | Status: DC | PRN
  Administered 2016-06-26: 650 mg via ORAL
  Filled 2016-06-26: qty 2

## 2016-06-26 MED ORDER — TRAZODONE HCL 50 MG PO TABS
50.0000 mg | ORAL_TABLET | Freq: Every evening | ORAL | Status: DC | PRN
  Administered 2016-06-26: 50 mg via ORAL
  Filled 2016-06-26: qty 1

## 2016-06-26 MED ORDER — GABAPENTIN 300 MG PO CAPS
300.0000 mg | ORAL_CAPSULE | Freq: Three times a day (TID) | ORAL | Status: DC
  Administered 2016-06-26 – 2016-06-27 (×4): 300 mg via ORAL
  Filled 2016-06-26 (×4): qty 1

## 2016-06-26 MED ORDER — DULOXETINE HCL 20 MG PO CPEP
40.0000 mg | ORAL_CAPSULE | Freq: Every day | ORAL | Status: DC
  Administered 2016-06-26 – 2016-06-27 (×2): 40 mg via ORAL
  Filled 2016-06-26 (×2): qty 2

## 2016-06-26 NOTE — Care Management Note (Signed)
Case Management Note  Patient Details  Name: Devon Harris MRN: [id number] Date of Birth: 01/27/1951  Subjective/Objective:         CM following for progression and d/c planning.            Action/Plan: 06/26/2016 Met with pt who states that he lives in a boarding house, uses a cane, may need a walker. Will follow for d/c needs.   Expected Discharge Date:                  Expected Discharge Plan:  [place]  In-House Referral:  Clinical Social Work  Discharge planning Services  CM Consult  Post Acute Care Choice:    Choice offered to:     DME Arranged:    DME Agency:     HH Arranged:    B and E Agency:     Status of Service:  In process, will continue to follow  If discussed at Long Length of Stay Meetings, dates discussed:    Additional Comments:  [name], RN 06/26/2016, 10:56 AM

## 2016-06-26 NOTE — Care Management Important Message (Signed)
Important Message  Patient Details  Name: Manual MeierOscar L Micucci MRN: [id number] Date of Birth: 08/06/1950   Medicare Important Message Given:  Yes    Amedeo Detweiler, Annamarie Majorheryl U, RN 06/26/2016, 10:55 AM

## 2016-06-26 NOTE — Progress Notes (Signed)
Called Joni Reiningicole patients daughter and left msg to return call.

## 2016-06-26 NOTE — Progress Notes (Addendum)
[facility]  PROGRESS NOTE  Devon Harris OZH:[id number] DOB: Jul 20, 1950 DOA: 06/25/2016 PCP: [facility]   Brief HPI:    66 y.o. male with medical history significant of drug abuse and dependence, gout, hypertension, depression was brought to the emergency department via EMS after he was found unresponsive at home by a neighbor.   Apparently, the patient overdosed on heroin. His neighbors/family found him between his bed and dresser. When seen by EMS he was only breathing after 4 RPM. He was given 2 mg of Narcan by EMS and was Ambu bagged with positive response. EMS reported that the patient woke up after 2 minutes and his mental status was improved. His daughter stated that the patient has a history of polysubstance abuse, has been admitted for substance rehabilitation and for depression before. He denies suicidal ideations. When asked, the patient denied using recreational drugs.  UDS was positive for cocaine and opiates . 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  Subjective   Patient seen and examined, denies suicidal ideation.   Assessment/Plan:     1. Altered mental status- likely from polysubstance abuse as patient was found unresponsive at home, woke up after he was given Narcan by EMS. UDS was positive for cocaine and opiates. He denies suicidal ideation. 2. Depression- continue fluoxetine 40 mg daily,  obtain psych consultation for further recommendations. 3. Acute kidney injury- improved after IV fluids, today creatinine is 1.36. Follow BMP in a.m. 4. Hypokalemia-potassium is 3.3, will replace potassium and check  BMP in a.m. 5. Hypertension-blood pressure stable, continue amlodipine 5 mg by mouth daily 6. Polysubstance abuse- we'll consult social work.    DVT prophylaxis: Lovenox  Code Status: Full code  Family Communication: Discussed with patient's daughter on phone   Disposition Plan:  Pending psychiatric evaluation, will follow the recommendations   Consultants:  None  Procedures:  None  Continuous infusions . sodium chloride 100 mL/hr at 06/26/16 1313      Antibiotics:   Anti-infectives    None       Objective   Vitals:   06/26/16 0700 06/26/16 0754 06/26/16 1622 06/26/16 1629  BP: 137/89 125/76 (!) 152/120 120/69  Pulse: 65 65 69   Resp: 18 16 16    Temp: 98 F (36.7 C)  97.8 F (36.6 C)   TempSrc: Oral  Oral   SpO2: 96% 98% 98%   Weight:      Height:        Intake/Output Summary (Last 24 hours) at 06/26/16 1732 Last data filed at 06/26/16 1249  Gross per 24 hour  Intake             1210 ml  Output                0 ml  Net             1210 ml   Filed Weights   06/26/16 0113  Weight: 90.6 kg (199 lb 11.2 oz)     Physical Examination:  General exam: Appears calm and comfortable. Respiratory system: Clear to auscultation. Respiratory effort normal. Cardiovascular system:  RRR. No  murmurs, rubs, gallops. No pedal edema. GI system: Abdomen is nondistended, soft and nontender. No organomegaly.  Central nervous system. No focal neurological deficits. 5 x 5 power in all extremities. Skin: No rashes, lesions or ulcers. Psychiatry: Alert, oriented x 3.Judgement and insight appear normal. Affect normal.  Data Reviewed: I have personally reviewed following labs and imaging studies  CBG: No results for input(s): GLUCAP in the last 168 hours.  CBC:  Recent Labs Lab 06/25/16 1926 06/26/16 0633  WBC 8.1 7.9  NEUTROABS 6.5  --   HGB 15.6 13.3  HCT 47.5 41.8  MCV 86.4 87.3  PLT 326 290    Basic Metabolic Panel:  Recent Labs Lab 06/25/16 1926 06/26/16 0633  NA 138 137  K 3.5 3.3*  CL 100* 102  CO2 26 26  GLUCOSE 104* 99  BUN 11 12  CREATININE 1.73* 1.36*  CALCIUM 8.8* 8.4*  MG  --  2.0    No results found for this or any previous visit (from the past 240 hour(s)).   Liver Function Tests:  Recent Labs Lab  06/25/16 1926  AST 19  ALT 22  ALKPHOS 82  BILITOT 0.5  PROT 7.6  ALBUMIN 4.0   No results for input(s): LIPASE, AMYLASE in the last 168 hours. No results for input(s): AMMONIA in the last 168 hours.  Cardiac Enzymes:  Recent Labs Lab 06/26/16 0633  CKTOTAL 243   BNP (last 3 results)  Recent Labs  06/25/16 1926  BNP 5.7    ProBNP (last 3 results) No results for input(s): PROBNP in the last 8760 hours.    Studies: Dg Chest 2 View  Result Date: 06/25/2016 CLINICAL DATA:  Cough and congestion x4 days EXAM: CHEST  2 VIEW COMPARISON:  None. FINDINGS: Heart is borderline enlarged. The thoracic aorta is uncoiled and tortuous in appearance without aneurysm. Lungs are free of pneumonic consolidations, effusions or pneumothoraces. No pulmonary edema. ACDF of the lower cervical spine. No acute osseous appearing abnormality. IMPRESSION: No active cardiopulmonary disease. Electronically Signed   By: Tollie Eth M.D.   On: 06/25/2016 19:43   Ct Head Wo Contrast  Result Date: 06/25/2016 CLINICAL DATA:  Acute onset of altered mental status. Initial encounter. EXAM: CT HEAD WITHOUT CONTRAST TECHNIQUE: Contiguous axial images were obtained from the base of the skull through the vertex without intravenous contrast. COMPARISON:  CT of the cervical spine performed 08/17/2014 FINDINGS: Brain: No evidence of acute infarction, hemorrhage, hydrocephalus, extra-axial collection or mass lesion/mass effect. Prominence of the ventricles and sulci reflects mild cortical volume loss. Scattered periventricular and subcortical white matter change likely reflects small vessel ischemic microangiopathy. Chronic lacunar infarcts are seen at the basal ganglia bilaterally, and at the right thalamus. A cavum septum pellucidum is noted. The brainstem and fourth ventricle are within normal limits. The cerebral hemispheres demonstrate grossly normal gray-white differentiation. No mass effect or midline shift is seen.  Vascular: No hyperdense vessel or unexpected calcification. Skull: There is no evidence of fracture; visualized osseous structures are unremarkable in appearance. Sinuses/Orbits: The orbits are within normal limits. Mucosal thickening is noted at the maxillary sinuses bilaterally. The remaining paranasal sinuses and mastoid air cells are well-aerated. Other: No significant soft tissue abnormalities are seen. IMPRESSION: 1. No acute intracranial pathology seen on CT. 2. Mild cortical volume loss and scattered small vessel ischemic microangiopathy. 3. Chronic lacunar infarcts at the basal ganglia bilaterally, and at the right thalamus. 4. Mucosal thickening at the maxillary sinuses bilaterally. Electronically Signed   By: Roanna Raider M.D.   On: 06/25/2016 21:54    Scheduled Meds: . amLODipine  5 mg Oral Daily  . DULoxetine HCl  40 mg Oral Daily  . enoxaparin (LOVENOX) injection  40 mg Subcutaneous Q24H  . feeding supplement (ENSURE ENLIVE)  237  mL Oral BID BM  . gabapentin  300 mg Oral TID  . potassium chloride  40 mEq Oral Once      Time spent: 25 min  [facility],Shyanne Mcclary S   [facility] Pager [phone]. If 7PM-7AM, please contact night-coverage at [email or web address], Office  [phone]  password [email or web address] 06/26/2016, 5:32 PM  LOS: 0 days

## 2016-06-26 NOTE — Consult Note (Addendum)
[place] Psychiatry Consult   Reason for Consult:  Overdose Referring Physician:  Dr. Darrick Meigs Patient Identification: Devon Harris MRN:  [id number] Principal Diagnosis: AKI (acute kidney injury) [facility]) Diagnosis:   Patient Active Problem List   Diagnosis Date Noted  . AKI (acute kidney injury) ([place]) [N17.9] 06/25/2016  . Hyperlipidemia [E78.5] 06/25/2016  . Hypertension [I10] 06/25/2016  . Polysubstance abuse [F19.10] 06/25/2016  . Depression [F32.9] 06/25/2016  . Major depressive disorder, recurrent severe without psychotic features ([place]) [F33.2] 12/31/2015  . Major depressive disorder, recurrent episode, severe, with psychosis ([place]) [F33.3] 12/31/2015    Total Time spent with patient: 30 minutes  Subjective:   Devon Harris is a 66 y.o. male patient admitted with overdose on opiates, requiring narcan administration and respiratory support by EMS.  Psychiatry consulted for recommendations on care.  HPI:  Devon Harris is a 66 year old male [place] veteran with a history of alcohol and cocaine abuse, opiate use disorder, gout, hypertension, and PTSD.  His psychiatric care is managed by Dr. Katheran James at the [facility].    Patient participated well with Probation officer. He reports that he took too much drugs. He acknowledges that he was using both cocaine and snorting opiates. He denies any IV drug use. He reports that he was not trying to harm himself or kill himself, and that he just "overdid it". Devon Harris reports that he is happy with his psychiatric care at the [facility] and wishes to follow up there.  He denies any suicidal thoughts, worsening depression, homicidal or aggressive thoughts, auditory hallucinations, visual hallucinations, or any worsening mental health needs. He displays limited insight into his substance use and how dangerous this episode may have been.    Devon Harris reports that his daughter is aware that he is here at the hospital and will help him  out for the next few days when he goes home. He reports that he is staying at a boarding house in [place], feels safe,and plans to return to that boarding house after discharge from the hospital.  He shares that he plans to move back to the [facility] area so that he can be closer to his mental health providers.  Past Psychiatric History: multiple psychiatric admissions at [facility], substance use, PTSD  Risk to Self: Is patient at risk for suicide?: No Risk to Others:   Prior Inpatient Therapy:   Prior Outpatient Therapy:    Past Medical History:  Past Medical History:  Diagnosis Date  . Drug abuse and dependence ([place])   . Gout   . Hypertension     Past Surgical History:  Procedure Laterality Date  . BACK SURGERY     Family History:  Family History  Problem Relation Age of Onset  . Hypertension Mother    Family Psychiatric  History: denies  Social History:  History  Alcohol Use  . Yes     History  Drug Use No    Social History   Social History  . Marital status: Single    Spouse name: N/A  . Number of children: N/A  . Years of education: N/A   Social History Main Topics  . Smoking status: Never Smoker  . Smokeless tobacco: Never Used  . Alcohol use Yes  . Drug use: No  . Sexual activity: Not Asked   Other Topics Concern  . None   Social History Narrative  . None   Additional Social History:    Allergies:  No Known Allergies  Labs:  Results for orders placed or performed during the hospital encounter of 06/25/16 (from the past 48 hour(s))  CBC with Differential/Platelet     Status: None   Collection Time: 06/25/16  7:26 PM  Result Value Ref Range   WBC 8.1 4.0 - 10.5 K/uL   RBC 5.50 4.22 - 5.81 MIL/uL   Hemoglobin 15.6 13.0 - 17.0 g/dL   HCT 47.5 39.0 - 52.0 %   MCV 86.4 78.0 - 100.0 fL   MCH 28.4 26.0 - 34.0 pg   MCHC 32.8 30.0 - 36.0 g/dL   RDW 13.9 11.5 - 15.5 %   Platelets 326 150 - 400 K/uL   Neutrophils Relative % 81 %   Neutro Abs  6.5 1.7 - 7.7 K/uL   Lymphocytes Relative 14 %   Lymphs Abs 1.1 0.7 - 4.0 K/uL   Monocytes Relative 5 %   Monocytes Absolute 0.4 0.1 - 1.0 K/uL   Eosinophils Relative 0 %   Eosinophils Absolute 0.0 0.0 - 0.7 K/uL   Basophils Relative 0 %   Basophils Absolute 0.0 0.0 - 0.1 K/uL  Comprehensive metabolic panel     Status: Abnormal   Collection Time: 06/25/16  7:26 PM  Result Value Ref Range   Sodium 138 135 - 145 mmol/L   Potassium 3.5 3.5 - 5.1 mmol/L   Chloride 100 (L) 101 - 111 mmol/L   CO2 26 22 - 32 mmol/L   Glucose, Bld 104 (H) 65 - 99 mg/dL   BUN 11 6 - 20 mg/dL   Creatinine, Ser 1.73 (H) 0.61 - 1.24 mg/dL   Calcium 8.8 (L) 8.9 - 10.3 mg/dL   Total Protein 7.6 6.5 - 8.1 g/dL   Albumin 4.0 3.5 - 5.0 g/dL   AST 19 15 - 41 U/L   ALT 22 17 - 63 U/L   Alkaline Phosphatase 82 38 - 126 U/L   Total Bilirubin 0.5 0.3 - 1.2 mg/dL   GFR calc non Af Amer 39 (L) >60 mL/min   GFR calc Af Amer 46 (L) >60 mL/min    Comment: (NOTE) The eGFR has been calculated using the CKD EPI equation. This calculation has not been validated in all clinical situations. eGFR's persistently <60 mL/min signify possible Chronic Kidney Disease.    Anion gap 12 5 - 15  Brain natriuretic peptide     Status: None   Collection Time: 06/25/16  7:26 PM  Result Value Ref Range   B Natriuretic Peptide 5.7 0.0 - 100.0 pg/mL  Acetaminophen level     Status: Abnormal   Collection Time: 06/25/16  7:26 PM  Result Value Ref Range   Acetaminophen (Tylenol), Serum <10 (L) 10 - 30 ug/mL    Comment:        THERAPEUTIC CONCENTRATIONS VARY SIGNIFICANTLY. A RANGE OF 10-30 ug/mL MAY BE AN EFFECTIVE CONCENTRATION FOR MANY PATIENTS. HOWEVER, SOME ARE BEST TREATED AT CONCENTRATIONS OUTSIDE THIS RANGE. ACETAMINOPHEN CONCENTRATIONS >150 ug/mL AT 4 HOURS AFTER INGESTION AND >50 ug/mL AT 12 HOURS AFTER INGESTION ARE OFTEN ASSOCIATED WITH TOXIC REACTIONS.   Salicylate level     Status: None   Collection Time: 06/25/16   7:26 PM  Result Value Ref Range   Salicylate Lvl <1.6 2.8 - 30.0 mg/dL  Ethanol     Status: None   Collection Time: 06/25/16  7:26 PM  Result Value Ref Range   Alcohol, Ethyl (B) <5 <5 mg/dL    Comment:        LOWEST  DETECTABLE LIMIT FOR SERUM ALCOHOL IS 5 mg/dL FOR MEDICAL PURPOSES ONLY   I-stat troponin, ED     Status: None   Collection Time: 06/25/16  7:34 PM  Result Value Ref Range   Troponin i, poc 0.02 0.00 - 0.08 ng/mL   Comment 3            Comment: Due to the release kinetics of cTnI, a negative result within the first hours of the onset of symptoms does not rule out myocardial infarction with certainty. If myocardial infarction is still suspected, repeat the test at appropriate intervals.   Rapid urine drug screen (hospital performed)     Status: Abnormal   Collection Time: 06/25/16  8:58 PM  Result Value Ref Range   Opiates POSITIVE (A) NONE DETECTED   Cocaine POSITIVE (A) NONE DETECTED   Benzodiazepines NONE DETECTED NONE DETECTED   Amphetamines NONE DETECTED NONE DETECTED   Tetrahydrocannabinol NONE DETECTED NONE DETECTED   Barbiturates NONE DETECTED NONE DETECTED    Comment:        DRUG SCREEN FOR MEDICAL PURPOSES ONLY.  IF CONFIRMATION IS NEEDED FOR ANY PURPOSE, NOTIFY LAB WITHIN 5 DAYS.        LOWEST DETECTABLE LIMITS FOR URINE DRUG SCREEN Drug Class       Cutoff (ng/mL) Amphetamine      1000 Barbiturate      200 Benzodiazepine   595 Tricyclics       638 Opiates          300 Cocaine          300 THC              50   Basic metabolic panel     Status: Abnormal   Collection Time: 06/26/16  6:33 AM  Result Value Ref Range   Sodium 137 135 - 145 mmol/L   Potassium 3.3 (L) 3.5 - 5.1 mmol/L   Chloride 102 101 - 111 mmol/L   CO2 26 22 - 32 mmol/L   Glucose, Bld 99 65 - 99 mg/dL   BUN 12 6 - 20 mg/dL   Creatinine, Ser 1.36 (H) 0.61 - 1.24 mg/dL   Calcium 8.4 (L) 8.9 - 10.3 mg/dL   GFR calc non Af Amer 53 (L) >60 mL/min   GFR calc Af Amer >60 >60  mL/min    Comment: (NOTE) The eGFR has been calculated using the CKD EPI equation. This calculation has not been validated in all clinical situations. eGFR's persistently <60 mL/min signify possible Chronic Kidney Disease.    Anion gap 9 5 - 15  CBC     Status: None   Collection Time: 06/26/16  6:33 AM  Result Value Ref Range   WBC 7.9 4.0 - 10.5 K/uL   RBC 4.79 4.22 - 5.81 MIL/uL   Hemoglobin 13.3 13.0 - 17.0 g/dL   HCT 41.8 39.0 - 52.0 %   MCV 87.3 78.0 - 100.0 fL   MCH 27.8 26.0 - 34.0 pg   MCHC 31.8 30.0 - 36.0 g/dL   RDW 13.9 11.5 - 15.5 %   Platelets 290 150 - 400 K/uL  CK     Status: None   Collection Time: 06/26/16  6:33 AM  Result Value Ref Range   Total CK 243 49 - 397 U/L  Magnesium     Status: None   Collection Time: 06/26/16  6:33 AM  Result Value Ref Range   Magnesium 2.0 1.7 - 2.4 mg/dL    Current  Facility-Administered Medications  Medication Dose Route Frequency Provider Last Rate Last Dose  . 0.9 %  sodium chloride infusion   Intravenous Continuous Reubin Milan, MD 100 mL/hr at 06/26/16 1313    . amLODipine (NORVASC) tablet 5 mg  5 mg Oral Daily Reubin Milan, MD      . DULoxetine HCl CPEP 40 mg  40 mg Oral Daily Reubin Milan, MD      . enoxaparin (LOVENOX) injection 40 mg  40 mg Subcutaneous Q24H Reubin Milan, MD   40 mg at 06/26/16 0915  . feeding supplement (ENSURE ENLIVE) (ENSURE ENLIVE) liquid 237 mL  237 mL Oral BID BM Reubin Milan, MD   237 mL at 06/26/16 1617  . gabapentin (NEURONTIN) capsule 300 mg  300 mg Oral TID Reubin Milan, MD      . hydrOXYzine (ATARAX/VISTARIL) tablet 25 mg  25 mg Oral Q6H PRN Reubin Milan, MD      . traZODone (DESYREL) tablet 50 mg  50 mg Oral QHS PRN Reubin Milan, MD        Musculoskeletal: Strength & Muscle Tone: within normal limits Gait & Station: normal Patient leans: N/A  Psychiatric Specialty Exam: Physical Exam  Review of Systems  Constitutional: Negative.    Respiratory: Negative.   Cardiovascular: Negative.   Musculoskeletal: Positive for joint pain.  Neurological: Negative.   Psychiatric/Behavioral: Positive for depression and substance abuse.    Blood pressure 120/69, pulse 69, temperature 97.8 F (36.6 C), temperature source Oral, resp. rate 16, height 5' 6" (1.676 m), weight 90.6 kg (199 lb 11.2 oz), SpO2 98 %.Body mass index is 32.23 kg/m.  General Appearance: Well Groomed  Eye Contact:  Good  Speech:  Clear and Coherent  Volume:  Normal  Mood:  Euthymic  Affect:  Congruent  Thought Process:  Coherent  Orientation:  Full (Time, Place, and Person)  Thought Content:  Logical  Suicidal Thoughts:  No  Homicidal Thoughts:  No  Memory:  Immediate;   Fair  Judgement:  Other:  intact superficially, but chronically poor  Insight:  Shallow  Psychomotor Activity:  Normal  Concentration:  Concentration: Good and Attention Span: Good  Recall:  NA  Fund of Knowledge:  Fair  Language:  Fair  Akathisia:  Negative  Handed:  Right  AIMS (if indicated):     Assets:  Communication Skills Desire for Improvement Financial Resources/Insurance Housing Social Support  ADL's:  Intact  Cognition:  WNL  Sleep:   6-7 hours    Treatment Plan Summary: Devon Harris is a 66 year old male with polysubstance use disorder, hypertension, and depression who is admit yesterday after overdose on opiates.  He acknowledges that he was using cocaine and opiates on discussion with Probation officer. He denies any intent to end his life, and denies any thoughts or plans to harm himself or others. He wishes to return home to his boarding house in [place] and continue outpatient psychiatric care at the [facility] with Dr. Katheran James.  Devon Harris has struggled with chronic substance abuse and has had repeated hospitalizations at the [facility], per his report. At present he does not appear to be experiencing any acute GABA withdrawal symptoms, and does  not appear to be acutely unsafe with himself or others. While he does display very poor insight and motivation to address his substance use, he does not meet criteria for an acute involuntary commitment. If he decides that he would like inpatient  psychiatric care for substance use, then we may be able to work with him in finding a residential treatment program. Alternatively, if he is medically cleared and wishes to be discharged, then he should have outpatient follow-up as below and resume his outpatient psychiatric medication regimen as prescribed.  Disposition: Patient does not meet criteria for psychiatric inpatient admission. Please coordiante with SW to be sure that patient has psychiatric follow-up  The [facility] can be reached at [phone], I'd suggest speaking with the [facility] or [facility] to arrange for a follow-up visit with Dr. Katheran James.  I would also suggest asking to speak with the SUD (substance use disorder) clinic to arrange for substance treatment.   Aundra Dubin, MD 06/26/2016 4:47 PM

## 2016-06-26 NOTE — Progress Notes (Signed)
Nutrition Brief Note  Patient identified on the Malnutrition Screening Tool (MST) Report  Wt Readings from Last 15 Encounters:  06/26/16 199 lb 11.2 oz (90.6 kg)  12/31/15 215 lb (97.5 kg)  09/02/14 215 lb (97.5 kg)  08/17/14 215 lb (97.5 kg)    Body mass index is 32.23 kg/m. Patient meets criteria for class I obesity based on current BMI. Pt reports weight loss was intentional.  Current diet order is heart, patient is consuming approximately 100% of meals at this time. Pt reports having a good appetite currently and PTA. Labs and medications reviewed. Pt currently has Ensure ordered and would like to continue with them.   No further nutrition interventions warranted at this time. If nutrition issues arise, please consult RD.   Devon SmilingStephanie Hazley Dezeeuw, MS, RD, LDN Pager # [phone] After hours/ weekend pager # [phone]

## 2016-06-27 DIAGNOSIS — N179 Acute kidney failure, unspecified: Secondary | ICD-10-CM

## 2016-06-27 LAB — BASIC METABOLIC PANEL
ANION GAP: 5 (ref 5–15)
BUN: 9 mg/dL (ref 6–20)
CALCIUM: 8.8 mg/dL — AB (ref 8.9–10.3)
CO2: 29 mmol/L (ref 22–32)
Chloride: 108 mmol/L (ref 101–111)
Creatinine, Ser: 1.04 mg/dL (ref 0.61–1.24)
GFR calc Af Amer: >60 mL/min (ref >60)
Glucose, Bld: 92 mg/dL (ref 65–99)
Potassium: 4.6 mmol/L (ref 3.5–5.1)
Sodium: 142 mmol/L (ref 135–145)

## 2016-06-27 NOTE — Discharge Summary (Addendum)
Physician Discharge Summary  NICKIE DEREN ZOX:[id number] DOB: [birth year] DOA: 06/25/2016  [facility]: [facility]  Admit date: 06/25/2016 Discharge date: 06/27/2016  PATIENT LEFT AGAINST MEDICAL ADVICE.  Admitted From: HOME Disposition:  Home  Recommendations for Outpatient Follow-up:  1. Recommended f/u with [facility]- Psych Emerg clinic, mental health clinic, Dr Cheri Rous.   Home Health:NO  Equipment/Devices: No  Discharge Condition: Stable (Stable, guarded, hospice) CODE STATUS:Full  (FULL, DNR, Comfort Care) Diet recommendation: Heart Healthy   Discharge Diagnoses:  Principal Problem:   AKI (acute kidney injury) (HCC) Active Problems:   Hyperlipidemia   Hypertension   Polysubstance abuse   Depression  1. Altered mental status- likely from polysubstance abuse as patient was found unresponsive at home, woke up after he was given Narcan by EMS. UDS was positive for cocaine and opiates. He denies suicidal ideation. - Physical therapy evaluation- pending. Pt left AMA without getting evaluated. - Consulted and called social work, left vice messages twice with no response. To help ensure f/u is arranged for pt as recommended by Psych below.  - As it was the weekend, called [facility] without response.  - Pt left AMA, he was stable for d/c but follow up could not be arranged over the weekend.   2. Depression- continue fluoxetine 40 mg daily. Psyh eval- 06/26/16- recommendations the patient can be discharged home and does not require inpatient psych admission. RECS- Please coordiante with SW to be sure that patient has psychiatric follow-up . [facility] can be reached at [phone], I'd suggest speaking with the Mental Health Clinic or the Psychiatric Emergency Clinic to arrange for a follow-up visit with Dr. Cheri Rous.  I would also suggest asking to speak with the SUD (substance use disorder) clinic to arrange for substance treatment.   3. Acute kidney injury- improved after  IV fluids, today creatinine is 1.04 2/17.  4. Hypokalemia-repleted 5. Hypertension-blood pressure stable, continue amlodipine 5 mg by mouth daily 6. Polysubstance abuse- called social woke twice, to help arrange follow-up with [facility]- mental health clinic and psych emergency clinic, substance use disorder clinic.   HPI and Exam on admission- HPI: CAMDYN BESKE is a 66 y.o. male with medical history significant of drug abuse and dependence, gout, hypertension, depression was brought to the emergency department via EMS after he was found unresponsive at home by a neighbor.   Apparently, the patient overdosed on heroin. His neighbors/family found him between his bed and dresser. When seen by EMS he was only breathing after 4 RPM. He was given 2 mg of Narcan by EMS and was Ambu bagged with positive response. EMS reported that the patient woke up after 2 minutes and his mental status was improved. His daughter stated that the patient has a history of polysubstance abuse, has been admitted for substance rehabilitation and for depression before. He denies suicidal ideations. When asked, the patient denied using recreational drugs.  ED Course: The patient was given a 500 mL normal saline bolus. His WBC was 8.1, hemoglobin 15.6 and platelets 326. Alcohol, aspirin and acetaminophen level were normal. UDS was positive for cocaine and opiates. His sodium was 138, potassium 3.5, chloride 100 and bicarbonate 26 mmol/liter. His BUN was 11, creatinine 1.73 and glucose 104 mg/dL. His chest radiograph did not show any acute cardiopulmonary pathology. 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 Please see radiology report for further details.  Physical Exam:  Constitutional:  NAD, calm, comfortable       Vitals:   06/25/16 1900 06/25/16 1915 06/25/16 2100 06/25/16 2130  BP: 136/94 119/83 (!) 141/105 143/93  Pulse: 103 102 82 76  Resp:   (!) 9 13   Temp:      TempSrc:      SpO2: (!) 89% 93% (!) 88% 97%   Eyes: PERRL, lids and conjunctivae normal ENMT: Mucous membranes are moist. Posterior pharynx clear of any exudate or lesions. Neck: normal, supple, no masses, no thyromegaly Respiratory: Mild rhonchi bilaterally, no wheezing, no crackles. Normal respiratory effort. No accessory muscle use.  Cardiovascular: Regular rate and rhythm, no murmurs / rubs / gallops. No extremity edema. 2+ pedal pulses. No carotid bruits.  Abdomen: no tenderness, no masses palpated. No hepatosplenomegaly. Bowel sounds positive.  Musculoskeletal: No clubbing / cyanosis. Good ROM, no contractures. Normal muscle tone.  Skin: no rashes, lesions, ulcers.  Neurologic: CN 2-12 grossly intact. Sensation intact, DTR normal. Strength 5/5 in all 4.  Psychiatric: Alert and oriented x 2Partially oriented to time and situation.    Discharge Instructions   Allergies as of 06/27/2016   No Known Allergies     Medication List    ASK your doctor about these medications   allopurinol 300 MG tablet Commonly known as:  ZYLOPRIM Take 300 mg by mouth daily.   amLODipine 10 MG tablet Commonly known as:  NORVASC Take 10 mg by mouth daily.   docusate sodium 100 MG capsule Commonly known as:  COLACE Take 100 mg by mouth daily as needed for mild constipation.   gabapentin 300 MG capsule Commonly known as:  NEURONTIN Take 1 capsule (300 mg total) by mouth 3 (three) times daily. For agitation   sertraline 100 MG tablet Commonly known as:  ZOLOFT Take 200 mg by mouth daily.   sildenafil 100 MG tablet Commonly known as:  VIAGRA Take 100 mg by mouth daily as needed for erectile dysfunction.   tamsulosin 0.4 MG Caps capsule Commonly known as:  FLOMAX Take 0.4 mg by mouth daily.   traZODone 100 MG tablet Commonly known as:  DESYREL Take 100 mg by mouth at bedtime as needed for sleep.   vitamin B-12 1000 MCG tablet Commonly known as:   CYANOCOBALAMIN Take 1,000 mcg by mouth 2 (two) times a week.      Consultations:  PSych    Procedures/Studies: Dg Chest 2 View  Result Date: 06/25/2016 CLINICAL DATA:  Cough and congestion x4 days EXAM: CHEST  2 VIEW COMPARISON:  None. FINDINGS: Heart is borderline enlarged. The thoracic aorta is uncoiled and tortuous in appearance without aneurysm. Lungs are free of pneumonic consolidations, effusions or pneumothoraces. No pulmonary edema. ACDF of the lower cervical spine. No acute osseous appearing abnormality. IMPRESSION: No active cardiopulmonary disease. Electronically Signed   By: Tollie Eth M.D.   On: 06/25/2016 19:43   Ct Head Wo Contrast  Result Date: 06/25/2016 CLINICAL DATA:  Acute onset of altered mental status. Initial encounter. EXAM: CT HEAD WITHOUT CONTRAST TECHNIQUE: Contiguous axial images were obtained from the base of the skull through the vertex without intravenous contrast. COMPARISON:  CT of the cervical spine performed 08/17/2014 FINDINGS: Brain: No evidence of acute infarction, hemorrhage, hydrocephalus, extra-axial collection or mass lesion/mass effect. Prominence of the ventricles and sulci reflects mild cortical volume loss. Scattered periventricular and subcortical white matter change likely reflects small vessel ischemic microangiopathy. Chronic lacunar infarcts are seen at the basal ganglia bilaterally, and at the right thalamus. A  cavum septum pellucidum is noted. The brainstem and fourth ventricle are within normal limits. The cerebral hemispheres demonstrate grossly normal gray-white differentiation. No mass effect or midline shift is seen. Vascular: No hyperdense vessel or unexpected calcification. Skull: There is no evidence of fracture; visualized osseous structures are unremarkable in appearance. Sinuses/Orbits: The orbits are within normal limits. Mucosal thickening is noted at the maxillary sinuses bilaterally. The remaining paranasal sinuses and mastoid air  cells are well-aerated. Other: No significant soft tissue abnormalities are seen. IMPRESSION: 1. No acute intracranial pathology seen on CT. 2. Mild cortical volume loss and scattered small vessel ischemic microangiopathy. 3. Chronic lacunar infarcts at the basal ganglia bilaterally, and at the right thalamus. 4. Mucosal thickening at the maxillary sinuses bilaterally. Electronically Signed   By: Roanna RaiderJeffery  Chang M.D.   On: 06/25/2016 21:54    Subjective: No complaints, eager to go home today.   Discharge Exam: Vitals:   06/27/16 0503 06/27/16 1000  BP: (!) 143/89 129/75  Pulse: 81 82  Resp: 16 18  Temp: 97.8 F (36.6 C) 98.2 F (36.8 C)   Vitals:   06/26/16 2258 06/26/16 2309 06/27/16 0503 06/27/16 1000  BP: (!) 86/57 109/76 (!) 143/89 129/75  Pulse: (!) 58  81 82  Resp: 16  16 18   Temp: 97.8 F (36.6 C)  97.8 F (36.6 C) 98.2 F (36.8 C)  TempSrc: Oral  Oral Oral  SpO2: 99%  100% 100%  Weight: 91.2 kg (201 lb 1 oz)     Height:        General: Pt is alert, awake, not in acute distress Cardiovascular: RRR, S1/S2 +, no rubs, no gallops Respiratory: CTA bilaterally, no wheezing, no rhonchi Abdominal: Soft, NT, ND, bowel sounds + Extremities: no edema, no cyanosis  The results of significant diagnostics from this hospitalization (including imaging, microbiology, ancillary and laboratory) are listed below for reference.    Labs: BNP (last 3 results)  Recent Labs  06/25/16 1926  BNP 5.7   Basic Metabolic Panel:  Recent Labs Lab 06/25/16 1926 06/26/16 0633 06/27/16 0726  NA 138 137 142  K 3.5 3.3* 4.6  CL 100* 102 108  CO2 26 26 29   GLUCOSE 104* 99 92  BUN 11 12 9   CREATININE 1.73* 1.36* 1.04  CALCIUM 8.8* 8.4* 8.8*  MG  --  2.0  --    Liver Function Tests:  Recent Labs Lab 06/25/16 1926  AST 19  ALT 22  ALKPHOS 82  BILITOT 0.5  PROT 7.6  ALBUMIN 4.0   CBC:  Recent Labs Lab 06/25/16 1926 06/26/16 0633  WBC 8.1 7.9  NEUTROABS 6.5  --   HGB  15.6 13.3  HCT 47.5 41.8  MCV 86.4 87.3  PLT 326 290   Cardiac Enzymes:  Recent Labs Lab 06/26/16 0633  CKTOTAL 243   Time coordinating discharge: Pt left AMA.   SIGNED:   Onnie BoerEjiroghene E Heiress Williamson, MD  [facility] 06/27/2016, 8:24 PM Pager [phone].  If 7PM-7AM, please contact night-coverage [email or web address] Password [email or web address]

## 2016-06-27 NOTE — Progress Notes (Signed)
Spoke with sister, Florentina AddisonKatie and informed of patient leaving. No other phone numbers listed. Katie to contact patient's daughter.

## 2016-06-27 NOTE — Progress Notes (Signed)
[facility]  PROGRESS NOTE  JAMICHEAL HEARD ZOX:[id number] DOB: 12-08-1950 DOA: 06/25/2016 [facility]: [facility]   Brief HPI:    66 y.o. male with medical history significant of drug abuse and dependence, gout, hypertension, depression was brought to the emergency department via EMS after he was found unresponsive at home by a neighbor.   Apparently, the patient overdosed on heroin. His neighbors/family found him between his bed and dresser. When seen by EMS he was only breathing after 4 RPM. He was given 2 mg of Narcan by EMS and was Ambu bagged with positive response. EMS reported that the patient woke up after 2 minutes and his mental status was improved. His daughter stated that the patient has a history of polysubstance abuse, has been admitted for substance rehabilitation and for depression before. He denies suicidal ideations. When asked, the patient denied using recreational drugs.  UDS was positive for cocaine and opiates . 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  Subjective   Patient awake, alert. At that time place and person. He has no chest pain, no shortness of breath. He is requesting discharge home, saying that [facility]ri [facility] his psychiatrist.   Assessment/Plan:    1. Altered mental status- likely from polysubstance abuse as patient was found unresponsive at home, woke up after he was given Narcan by EMS. UDS was positive for cocaine and opiates. He denies suicidal ideation. - Physical therapy evaluation 2. Depression- continue fluoxetine 40 mg daily. Psyh eval- 06/26/16- recommendations the patient can be discharged home and does not require inpatient psych admission. To coordinate with social work to be sure patient has psychiatric follow-up. - Social work consult 3. Acute kidney injury- improved after IV fluids, today creatinine is 1.04 2/17. DC IV  fluids 4. Hypokalemia-repleted 5. Hypertension-blood pressure stable, continue amlodipine 5 mg by mouth daily 6. Polysubstance abuse- called social woke twice, to help arrange follow-up with [facility]- mental health clinic and psych emergency clinic, substance use disorder clinic.  DVT prophylaxis: Lovenox  Code Status: Full code  Family Communication: Discussed with patient's daughterJoni Reining on phone. She is very concerned about her father, she would like her father admitted to an inpatient psych facility or [facility], but she understands that patient has to make the choice he cannot be commited involuntarily without psych recommendation.   Disposition Plan: Pending arrangement of outpt follow-up considering the reason why patient was admitted I will feel more comfortable discharging the patient with definite follow-up.   Consultants:  Psychiatric  Procedures:  None  Continuous infusions  DC IV fluids normal saline  Antibiotics:   Anti-infectives    None      Objective   Vitals:   06/26/16 2258 06/26/16 2309 06/27/16 0503 06/27/16 1000  BP: (!) 86/57 109/76 (!) 143/89 129/75  Pulse: (!) 58  81 82  Resp: 16  16 18   Temp: 97.8 F (36.6 C)  97.8 F (36.6 C) 98.2 F (36.8 C)  TempSrc: Oral  Oral Oral  SpO2: 99%  100% 100%  Weight: 91.2 kg (201 lb 1 oz)     Height:        Intake/Output Summary (Last 24 hours) at 06/27/16 1551 Last data filed at 06/27/16 1300  Gross per 24 hour  Intake              660 ml  Output  2050 ml  Net            -1390 ml   Filed Weights   06/26/16 0113 06/26/16 2258  Weight: 90.6 kg (199 lb 11.2 oz) 91.2 kg (201 lb 1 oz)     Physical Examination:  General exam: Appears calm and comfortable. Respiratory system: Clear to auscultation. Respiratory effort normal. Cardiovascular system:  RRR. No  murmurs, rubs, gallops. No pedal edema. GI system: Abdomen is nondistended, soft and nontender. No organomegaly.  Central  nervous system. No focal neurological deficits. 5 x 5 power in all extremities. Skin: No rashes, lesions or ulcers. Psychiatry: Alert, oriented x 3.Judgement and insight appear normal. Affect normal.  Data Reviewed: I have personally reviewed following labs and imaging studies  CBG: No results for input(s): GLUCAP in the last 168 hours.  CBC:  Recent Labs Lab 06/25/16 1926 06/26/16 0633  WBC 8.1 7.9  NEUTROABS 6.5  --   HGB 15.6 13.3  HCT 47.5 41.8  MCV 86.4 87.3  PLT 326 290    Basic Metabolic Panel:  Recent Labs Lab 06/25/16 1926 06/26/16 0633 06/27/16 0726  NA 138 137 142  K 3.5 3.3* 4.6  CL 100* 102 108  CO2 26 26 29   GLUCOSE 104* 99 92  BUN 11 12 9   CREATININE 1.73* 1.36* 1.04  CALCIUM 8.8* 8.4* 8.8*  MG  --  2.0  --     Liver Function Tests:  Recent Labs Lab 06/25/16 1926  AST 19  ALT 22  ALKPHOS 82  BILITOT 0.5  PROT 7.6  ALBUMIN 4.0   Cardiac Enzymes:  Recent Labs Lab 06/26/16 0633  CKTOTAL 243   BNP (last 3 results)  Recent Labs  06/25/16 1926  BNP 5.7   Studies: Dg Chest 2 View  Result Date: 06/25/2016 CLINICAL DATA:  Cough and congestion x4 days EXAM: CHEST  2 VIEW COMPARISON:  None. FINDINGS: Heart is borderline enlarged. The thoracic aorta is uncoiled and tortuous in appearance without aneurysm. Lungs are free of pneumonic consolidations, effusions or pneumothoraces. No pulmonary edema. ACDF of the lower cervical spine. No acute osseous appearing abnormality. IMPRESSION: No active cardiopulmonary disease. Electronically Signed   By: Tollie Eth M.D.   On: 06/25/2016 19:43   Ct Head Wo Contrast  Result Date: 06/25/2016 CLINICAL DATA:  Acute onset of altered mental status. Initial encounter. EXAM: CT HEAD WITHOUT CONTRAST TECHNIQUE: Contiguous axial images were obtained from the base of the skull through the vertex without intravenous contrast. COMPARISON:  CT of the cervical spine performed 08/17/2014 FINDINGS: Brain: No evidence  of acute infarction, hemorrhage, hydrocephalus, extra-axial collection or mass lesion/mass effect. Prominence of the ventricles and sulci reflects mild cortical volume loss. Scattered periventricular and subcortical white matter change likely reflects small vessel ischemic microangiopathy. Chronic lacunar infarcts are seen at the basal ganglia bilaterally, and at the right thalamus. A cavum septum pellucidum is noted. The brainstem and fourth ventricle are within normal limits. The cerebral hemispheres demonstrate grossly normal gray-white differentiation. No mass effect or midline shift is seen. Vascular: No hyperdense vessel or unexpected calcification. Skull: There is no evidence of fracture; visualized osseous structures are unremarkable in appearance. Sinuses/Orbits: The orbits are within normal limits. Mucosal thickening is noted at the maxillary sinuses bilaterally. The remaining paranasal sinuses and mastoid air cells are well-aerated. Other: No significant soft tissue abnormalities are seen. IMPRESSION: 1. No acute intracranial pathology seen on CT. 2. Mild cortical volume loss and scattered small vessel ischemic microangiopathy. 3.  Chronic lacunar infarcts at the basal ganglia bilaterally, and at the right thalamus. 4. Mucosal thickening at the maxillary sinuses bilaterally. Electronically Signed   By: Roanna RaiderJeffery  Chang M.D.   On: 06/25/2016 21:54    Scheduled Meds: . amLODipine  5 mg Oral Daily  . DULoxetine  40 mg Oral Daily  . enoxaparin (LOVENOX) injection  40 mg Subcutaneous Q24H  . feeding supplement (ENSURE ENLIVE)  237 mL Oral BID BM  . gabapentin  300 mg Oral TID    Time spent: 25 min  Tranesha Lessner E Chloee Tena   [facility] Pager [phone].  If 7PM-7AM, please contact night-coverage at [email or web address], Office  [phone]  password [email or web address] 06/27/2016, 3:51 PM  LOS: 1 day

## 2016-06-27 NOTE — Progress Notes (Signed)
Patient refused to stay in hospital. Signed AMA form after explanation/advice given to patient. MD notified. IV/Tele d/c'd. Patient ambulatory and left.

## 2017-03-23 ENCOUNTER — Encounter (HOSPITAL_COMMUNITY): Payer: Self-pay | Admitting: Emergency Medicine

## 2017-03-23 ENCOUNTER — Emergency Department (HOSPITAL_COMMUNITY)
Admission: EM | Admit: 2017-03-23 | Discharge: 2017-03-23 | Disposition: A | Discharge status: Home / Self Care | Payer: Medicare Other | Attending: Emergency Medicine | Admitting: Emergency Medicine

## 2017-03-23 DIAGNOSIS — F331 Major depressive disorder, recurrent, moderate: Secondary | ICD-10-CM | POA: Insufficient documentation

## 2017-03-23 DIAGNOSIS — I10 Essential (primary) hypertension: Secondary | ICD-10-CM | POA: Diagnosis not present

## 2017-03-23 DIAGNOSIS — F1499 Cocaine use, unspecified with unspecified cocaine-induced disorder: Secondary | ICD-10-CM | POA: Diagnosis not present

## 2017-03-23 DIAGNOSIS — R45851 Suicidal ideations: Secondary | ICD-10-CM | POA: Diagnosis not present

## 2017-03-23 DIAGNOSIS — F329 Major depressive disorder, single episode, unspecified: Secondary | ICD-10-CM | POA: Diagnosis present

## 2017-03-23 LAB — ACETAMINOPHEN LEVEL

## 2017-03-23 LAB — RAPID URINE DRUG SCREEN, HOSP PERFORMED
Amphetamines: NOT DETECTED
BARBITURATES: NOT DETECTED
Benzodiazepines: NOT DETECTED
COCAINE: POSITIVE — AB
OPIATES: NOT DETECTED
Tetrahydrocannabinol: NOT DETECTED

## 2017-03-23 LAB — CBC
HCT: 47.3 % (ref 39.0–52.0)
Hemoglobin: 15.7 g/dL (ref 13.0–17.0)
MCH: 29.2 pg (ref 26.0–34.0)
MCHC: 33.2 g/dL (ref 30.0–36.0)
MCV: 88.1 fL (ref 78.0–100.0)
Platelets: 320 10*3/uL (ref 150–400)
RBC: 5.37 MIL/uL (ref 4.22–5.81)
RDW: 13.5 % (ref 11.5–15.5)
WBC: 4.1 10*3/uL (ref 4.0–10.5)

## 2017-03-23 LAB — COMPREHENSIVE METABOLIC PANEL
ALK PHOS: 63 U/L (ref 38–126)
ALT: 15 U/L — ABNORMAL LOW (ref 17–63)
ANION GAP: 8 (ref 5–15)
AST: 17 U/L (ref 15–41)
Albumin: 4.1 g/dL (ref 3.5–5.0)
BUN: 10 mg/dL (ref 6–20)
CALCIUM: 9.5 mg/dL (ref 8.9–10.3)
CO2: 28 mmol/L (ref 22–32)
Chloride: 102 mmol/L (ref 101–111)
Creatinine, Ser: 1.26 mg/dL — ABNORMAL HIGH (ref 0.61–1.24)
GFR calc non Af Amer: 57 mL/min — ABNORMAL LOW (ref >60)
Glucose, Bld: 99 mg/dL (ref 65–99)
Potassium: 4.4 mmol/L (ref 3.5–5.1)
Sodium: 138 mmol/L (ref 135–145)
TOTAL PROTEIN: 7.7 g/dL (ref 6.5–8.1)
Total Bilirubin: 0.5 mg/dL (ref 0.3–1.2)

## 2017-03-23 LAB — SALICYLATE LEVEL

## 2017-03-23 LAB — ETHANOL

## 2017-03-23 NOTE — ED Triage Notes (Signed)
Patient brought in by [employer] voluntarily from home for SI. Patient denies a plan but states having thoughts of "not wanting to be here anymore". Patient reports [facility].

## 2017-03-23 NOTE — Discharge Instructions (Addendum)
For your behavioral health needs, you are advised to follow up with the following [facility] service providers:       [facility]      [address]      [place], [place] [address]      [phone]       [facility]      [address]      [place], [place] [address]      [phone]      Hours of operation: 8:00 am - 7:00 pm, Monday - Friday  For crisis services for veterans, call the [employer], available 24 hours a day, 7 days a week:       [facility] [facility]      [phone]  Return to the Ed if you develop any worsening symptoms.

## 2017-03-23 NOTE — ED Notes (Signed)
Patient not able to provide urine at this time

## 2017-03-23 NOTE — BH Assessment (Signed)
[facility]  Per Laveda AbbeLaurie Britton Parks, FNP, this pt does not require psychiatric hospitalization at this time.  [facility] with referral information for [facility] service providers.  Discharge instructions include referral information for the [facility] and for the [facility].  The [facility] 24 hour crisis phone number has also been included.  Pt's nurse has been notified.  Doylene Canninghomas Elyanna Wallick, MA Triage Specialist [phone]

## 2017-03-23 NOTE — BH Assessment (Signed)
[facility] Assessment Progress Note  Case discussed with Marquette OldLaurie P., NP who states the pt does not meet inpt criteria. [facility] is [facility] with the [facility]. Pt's nurse Carlisle BeersStanhope, Catharine M, RN and EDP Cruzita LedererLeaphart, Lynann BeaverKenneth T, PA-C notified of disposition.   Princess BruinsAquicha Syan Cullimore, MSW, LCSW Therapeutic Triage Specialist  [phone]

## 2017-03-23 NOTE — ED Notes (Signed)
TTS at bedside. 

## 2017-03-23 NOTE — BH Assessment (Addendum)
Assessment Note  Devon Harris is an 66 y.o. male who presents to the ED voluntarily due to ongoing depression. Pt states he moved to [place] from [place] to "get away from issues" in [place]. Pt is vague and does not disclose what "issues" he was attempting to get away from. Pt reports he has been feeling suicidal but denies that he has a plan. Pt states he feels isolated and alone. Pt states he feels he does not want to be alive anymore but denies that he has attempted suicide. Pt reports he wants to go to the [place] in [place] but does not have a way to get there. Pt states [employer] brought him to the ED after he told them he was suicidal and wanted to go to the [place]. Pt states he "just feels that nothing is going right." Pt also reports a hx of cocaine use and states he uses whenever he feels down. Pt reports he last used 2 days ago. Pt denies HI and denies AVH at present. Pt is cooperative throughout the assessment and responds appropriately to questions. Pt makes good eye contact with this Clinical research associate and states he "just wants to go to the [place]."   Case discussed with Marquette Old., NP who states the pt does not meet inpt criteria. [facility] Pt's nurse Carlisle Beers, RN and EDP Cruzita Lederer Lynann Beaver, PA-C notified of disposition.   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 Drug abuse and dependence (HCC)   . Gout   . Hypertension     Past Surgical History:  Procedure Laterality Date  . BACK SURGERY      Family History:  Family History  Problem Relation Age of Onset  . Hypertension Mother     Social History:  reports that  has never smoked. he has never used smokeless tobacco. He reports that he drinks alcohol. He reports that he does not use drugs.  Additional Social History:  Alcohol / Drug Use Pain Medications: See MAR Prescriptions: See  MAR Over the Counter: See MAR History of alcohol / drug use?: Yes Substance #1 Name of Substance 1: Cocaine 1 - Age of First Use: 40s 1 - Amount (size/oz): unknown 1 - Frequency: rare 1 - Duration: ongoing 1 - Last Use / Amount: 2 days ago  CIWA: CIWA-Ar BP: (!) 146/86 Pulse Rate: 86 COWS:    Allergies: No Known Allergies  Home Medications:  (Not in a hospital admission)  OB/GYN Status:  No LMP for male patient.  General Assessment Data [facility] Assessment: In system Is this a Tele or Face-to-Face Assessment?: Face-to-Face Is this an Initial Assessment or a Re-assessment for this encounter?: Initial Assessment Marital status: Single Is patient pregnant?: No Pregnancy Status: No Living Arrangements: Alone Can pt return to current living arrangement?: Yes Admission Status: Voluntary Is patient capable of signing voluntary admission?: Yes Referral Source: Self/Family/Friend Insurance type: Medicare     Crisis Care Plan Living Arrangements: Alone Name of Psychiatrist: [facility] Name of Therapist: [place] [facility]  Education Status Is patient currently in school?: No Highest grade of school patient has completed: GED Contact person: self  Risk to self with the past 6 months Suicidal Ideation: Yes-Currently Present Has patient been a risk to self within the past 6 months prior to admission? : No Suicidal Intent: No Has patient  had any suicidal intent within the past 6 months prior to admission? : No Is patient at risk for suicide?: No Suicidal Plan?: No-Not Currently/Within Last 6 Months Has patient had any suicidal plan within the past 6 months prior to admission? : No Access to Means: No What has been your use of drugs/alcohol within the last 12 months?: reports to some cocaine use  Previous Attempts/Gestures: No Triggers for Past Attempts: None known Intentional Self Injurious Behavior: None Family Suicide History: No Recent stressful  life event(s): Other (Comment)(isolated, lack of support ) Persecutory voices/beliefs?: No Depression: Yes Depression Symptoms: Despondent, Isolating, Loss of interest in usual pleasures Substance abuse history and/or treatment for substance abuse?: Yes Suicide prevention information given to non-admitted patients: Not applicable  Risk to Others within the past 6 months Homicidal Ideation: No Does patient have any lifetime risk of violence toward others beyond the six months prior to admission? : No Thoughts of Harm to Others: No Current Homicidal Intent: No Current Homicidal Plan: No Access to Homicidal Means: No History of harm to others?: No Assessment of Violence: None Noted Does patient have access to weapons?: No Criminal Charges Pending?: No Does patient have a court date: No Is patient on probation?: No  Psychosis Hallucinations: None noted Delusions: None noted  Mental Status Report Appearance/Hygiene: In scrubs, Unremarkable Eye Contact: Good Motor Activity: Freedom of movement Speech: Logical/coherent Level of Consciousness: Alert Mood: Depressed, Sad Affect: Depressed, Flat Anxiety Level: None Thought Processes: Relevant, Coherent Judgement: Unimpaired Orientation: Person, Place, Time, Situation, Appropriate for developmental age Obsessive Compulsive Thoughts/Behaviors: None  Cognitive Functioning Concentration: Normal Memory: Remote Intact, Recent Intact IQ: Average Insight: Good Impulse Control: Good Appetite: Good Sleep: No Change Total Hours of Sleep: 8 Vegetative Symptoms: None  ADLScreening [facility]) Patient's cognitive ability adequate to safely complete daily activities?: Yes Patient able to express need for assistance with ADLs?: Yes Independently performs ADLs?: Yes (appropriate for developmental age)  Prior Inpatient Therapy Prior Inpatient Therapy: Yes Prior Therapy Dates: 2017 [facility] Reason for Treatment: Depression  Prior Outpatient Therapy Prior Outpatient Therapy: Yes Prior Therapy Dates: 2017 [facility] Reason for Treatment: Depression Does patient have an ACCT team?: No Does patient have Intensive In-House Services?  : No Does patient have [facility] services? : No Does patient have P4CC services?: No  ADL Screening (condition at time of admission) Patient's cognitive ability adequate to safely complete daily activities?: Yes Is the patient deaf or have difficulty hearing?: No Does the patient have difficulty seeing, even when wearing glasses/contacts?: No Does the patient have difficulty concentrating, remembering, or making decisions?: No Patient able to express need for assistance with ADLs?: Yes Does the patient have difficulty dressing or bathing?: No Independently performs ADLs?: Yes (appropriate for developmental age) Does the patient have difficulty walking or climbing stairs?: No Weakness of Legs: None Weakness of Arms/Hands: None  Home Assistive Devices/Equipment Home Assistive Devices/Equipment: Cane (specify quad or straight)    Abuse/Neglect Assessment (Assessment to be complete while patient is alone) Abuse/Neglect Assessment Can Be Completed: Yes Physical Abuse: Denies Verbal Abuse: Denies Sexual Abuse: Denies Exploitation of patient/patient's resources: Denies Self-Neglect: Denies     Merchant navy officerAdvance Directives (For Healthcare) Does Patient Have a Medical Advance Directive?: No Would patient like information on creating a medical advance directive?: No - Patient declined    Additional Information 1:1 In Past 12 Months?: No CIRT Risk: No Elopement Risk: No Does patient have medical clearance?: Yes  Disposition:  Disposition Initial Assessment Completed for this Encounter: Yes Disposition of Patient: Discharge with Outpatient Resources(per Marquette OldLaurie P., NP [facility])  On Site Evaluation  by:   Reviewed with Physician:    Karolee OhsAquicha R Isayah Ignasiak 03/23/2017 12:45 PM

## 2017-03-23 NOTE — ED Provider Notes (Signed)
[facility] DEPT Provider Note   CSN: [id number] Arrival date & time: 03/23/17  1054     History   Chief Complaint Chief Complaint  Patient presents with  . Suicidal    HPI Devon Harris is a 66 y.o. male.  HPI 66 year old African-American male past medical history significant for polysubstance abuse, hypertension, depression, PTSD, gout that presents to the emergency department for evaluation of suicidal ideations.  Patient is coming from home by [employer].  Patient states that he has had thoughts of "not wanting to be here anymore and hurting himself".  Patient denies any specific plan.  Patient has history of same.  He has been admitted before to the behavioral hospital.  Patient states he is seen by the [facility] which is where his psychiatrist is.  Patient states that he wanted officers taken to the [place] but they brought him to [place] instead.  Patient denies any associated homicidal ideations.  He denies any auditory or visual hallucinations.  Patient denies any current alcohol use, tobacco use, any other drug use.  Patient has had no attempt today.  Pt denies any fever, chill, ha, vision changes, lightheadedness, dizziness, congestion, neck pain, cp, sob, cough, abd pain, n/v/d, urinary symptoms, change in bowel habits, melena, hematochezia, lower extremity paresthesias.  Past Medical History:  Diagnosis Date  . Drug abuse and dependence ([place])   . Gout   . Hypertension     Patient Active Problem List   Diagnosis Date Noted  . AKI (acute kidney injury) ([place]) 06/25/2016  . Hyperlipidemia 06/25/2016  . Hypertension 06/25/2016  . Polysubstance abuse ([place]) 06/25/2016  . Depression 06/25/2016  . Major depressive disorder, recurrent severe without psychotic features ([place]) 12/31/2015  . Major depressive disorder, recurrent episode, severe, with psychosis ([place]) 12/31/2015    Past Surgical History:  Procedure Laterality Date  . BACK SURGERY         Home  Medications    Prior to Admission medications   Medication Sig Start Date End Date Taking? Authorizing Provider  allopurinol (ZYLOPRIM) 300 MG tablet Take 300 mg by mouth daily.    [provider]  amLODipine (NORVASC) 10 MG tablet Take 10 mg by mouth daily.    [provider]  docusate sodium (COLACE) 100 MG capsule Take 100 mg by mouth daily as needed for mild constipation.    [provider]  gabapentin (NEURONTIN) 300 MG capsule Take 1 capsule (300 mg total) by mouth 3 (three) times daily. For agitation 01/06/16   Lindell Spar I, NP  sertraline (ZOLOFT) 100 MG tablet Take 200 mg by mouth daily.    [provider]  sildenafil (VIAGRA) 100 MG tablet Take 100 mg by mouth daily as needed for erectile dysfunction.    [provider]  tamsulosin (FLOMAX) 0.4 MG CAPS capsule Take 0.4 mg by mouth daily.    [provider]  traZODone (DESYREL) 100 MG tablet Take 100 mg by mouth at bedtime as needed for sleep.    [provider]  vitamin B-12 (CYANOCOBALAMIN) 1000 MCG tablet Take 1,000 mcg by mouth 2 (two) times a week.    [provider]    Family History Family History  Problem Relation Age of Onset  . Hypertension Mother     Social History Social History   Tobacco Use  . Smoking status: Never Smoker  . Smokeless tobacco: Never Used  Substance Use Topics  . Alcohol use: Yes  . Drug use: No  Allergies   Patient has no known allergies.   Review of Systems Review of Systems  Constitutional: Negative for chills and fever.  HENT: Negative for congestion and sore throat.   Eyes: Negative for visual disturbance.  Respiratory: Negative for cough and shortness of breath.   Cardiovascular: Negative for chest pain.  Gastrointestinal: Negative for abdominal pain, diarrhea, nausea and vomiting.  Genitourinary: Negative for dysuria, flank pain, frequency, hematuria, scrotal swelling, testicular pain and urgency.    Musculoskeletal: Negative for arthralgias and myalgias.  Skin: Negative for rash.  Neurological: Negative for dizziness, syncope, weakness, light-headedness, numbness and headaches.  Psychiatric/Behavioral: Positive for behavioral problems and suicidal ideas. Negative for sleep disturbance. The patient is not nervous/anxious.      Physical Exam Updated Vital Signs BP (!) 146/86 (BP Location: Right Arm)   Pulse 86   Temp 98 F (36.7 C) (Oral)   Resp 18   SpO2 96%   Physical Exam  Constitutional: He is oriented to person, place, and time. He appears well-developed and well-nourished.  Non-toxic appearance. No distress.  HENT:  Head: Normocephalic and atraumatic.  Mouth/Throat: Oropharynx is clear and moist.  Eyes: Conjunctivae are normal. Pupils are equal, round, and reactive to light. Right eye exhibits no discharge. Left eye exhibits no discharge.  Neck: Normal range of motion. Neck supple.  Cardiovascular: Normal rate, regular rhythm, normal heart sounds and intact distal pulses. Exam reveals no gallop and no friction rub.  No murmur heard. Pulmonary/Chest: Effort normal and breath sounds normal. No stridor. No respiratory distress. He has no wheezes. He has no rales. He exhibits no tenderness.  Abdominal: Soft. Bowel sounds are normal. He exhibits no distension. There is no tenderness. There is no rebound and no guarding.  Musculoskeletal: Normal range of motion. He exhibits no tenderness.  Lymphadenopathy:    He has no cervical adenopathy.  Neurological: He is alert and oriented to person, place, and time.  Skin: Skin is warm and dry. Capillary refill takes less than 2 seconds. No rash noted.  Psychiatric: Judgment normal. His speech is delayed. He is withdrawn. He is not actively hallucinating. He exhibits a depressed mood. He expresses suicidal ideation. He expresses no homicidal ideation. He expresses no suicidal plans and no homicidal plans.  Nursing note and vitals  reviewed.    ED Treatments / Results  Labs (all labs ordered are listed, but only abnormal results are displayed) Labs Reviewed  COMPREHENSIVE METABOLIC PANEL  ETHANOL  SALICYLATE LEVEL  ACETAMINOPHEN LEVEL  CBC  RAPID URINE DRUG SCREEN, HOSP PERFORMED    EKG  EKG Interpretation None       Radiology No results found.  Procedures Procedures (including critical care time)  Medications Ordered in ED Medications - No data to display   Initial Impression / Assessment and Plan / ED Course  I have reviewed the triage vital signs and the nursing notes.  Pertinent labs & imaging results that were available during my care of the patient were reviewed by me and considered in my medical decision making (see chart for details).     Patient presents to the ED with history of PTSD, suicidal ideations, depression for evaluation of suicidal ideations today.  Patient states that he does not want to live anymore.  Has no specific plan.  Patient has no medical complaints at this time.  Medical screening labs have been reassuring.  Physical exam was unremarkable.  TTS was consulted and awaiting their recommendations and disposition.  Patient is hemodynamic stable  at this time and resting comfortably in bed.  Pt uds positive for cocaine.   TTS spoke with pt and they did not feel like pt met inpatient criteria.  They recommend outpatient resources.  Patient remains hemodynamically stable at this time and has no medical complaints.  Patient is medically stable at this time and cleared medically.  Pt is hemodynamically stable, in NAD, & able to ambulate in the ED. Evaluation does not show pathology that would require ongoing emergent intervention or inpatient treatment. I explained the diagnosis to the patient. Pain has been managed & has no complaints prior to dc. Pt is comfortable with above plan and is stable for discharge at this time. All questions were answered prior to disposition. Strict  return precautions for f/u to the ED were discussed. Encouraged follow up with PCP.     Final Clinical Impressions(s) / ED Diagnoses   Final diagnoses:  Suicidal ideation    ED Discharge Orders    None       Aaron Edelman 03/23/17 1337    Tegeler, Gwenyth Allegra, MD 03/23/17 2026

## 2017-03-23 NOTE — ED Notes (Signed)
Bed: [facility] Expected date:  Expected time:  Means of arrival:  Comments: 

## 2018-04-08 IMAGING — CR DG CHEST 2V
2 series · 2 of 2 positions shown · non-contrast
Comparison: None.

CLINICAL DATA: Cough and congestion x4 days

EXAM:
CHEST  2 VIEW

[chest pa]
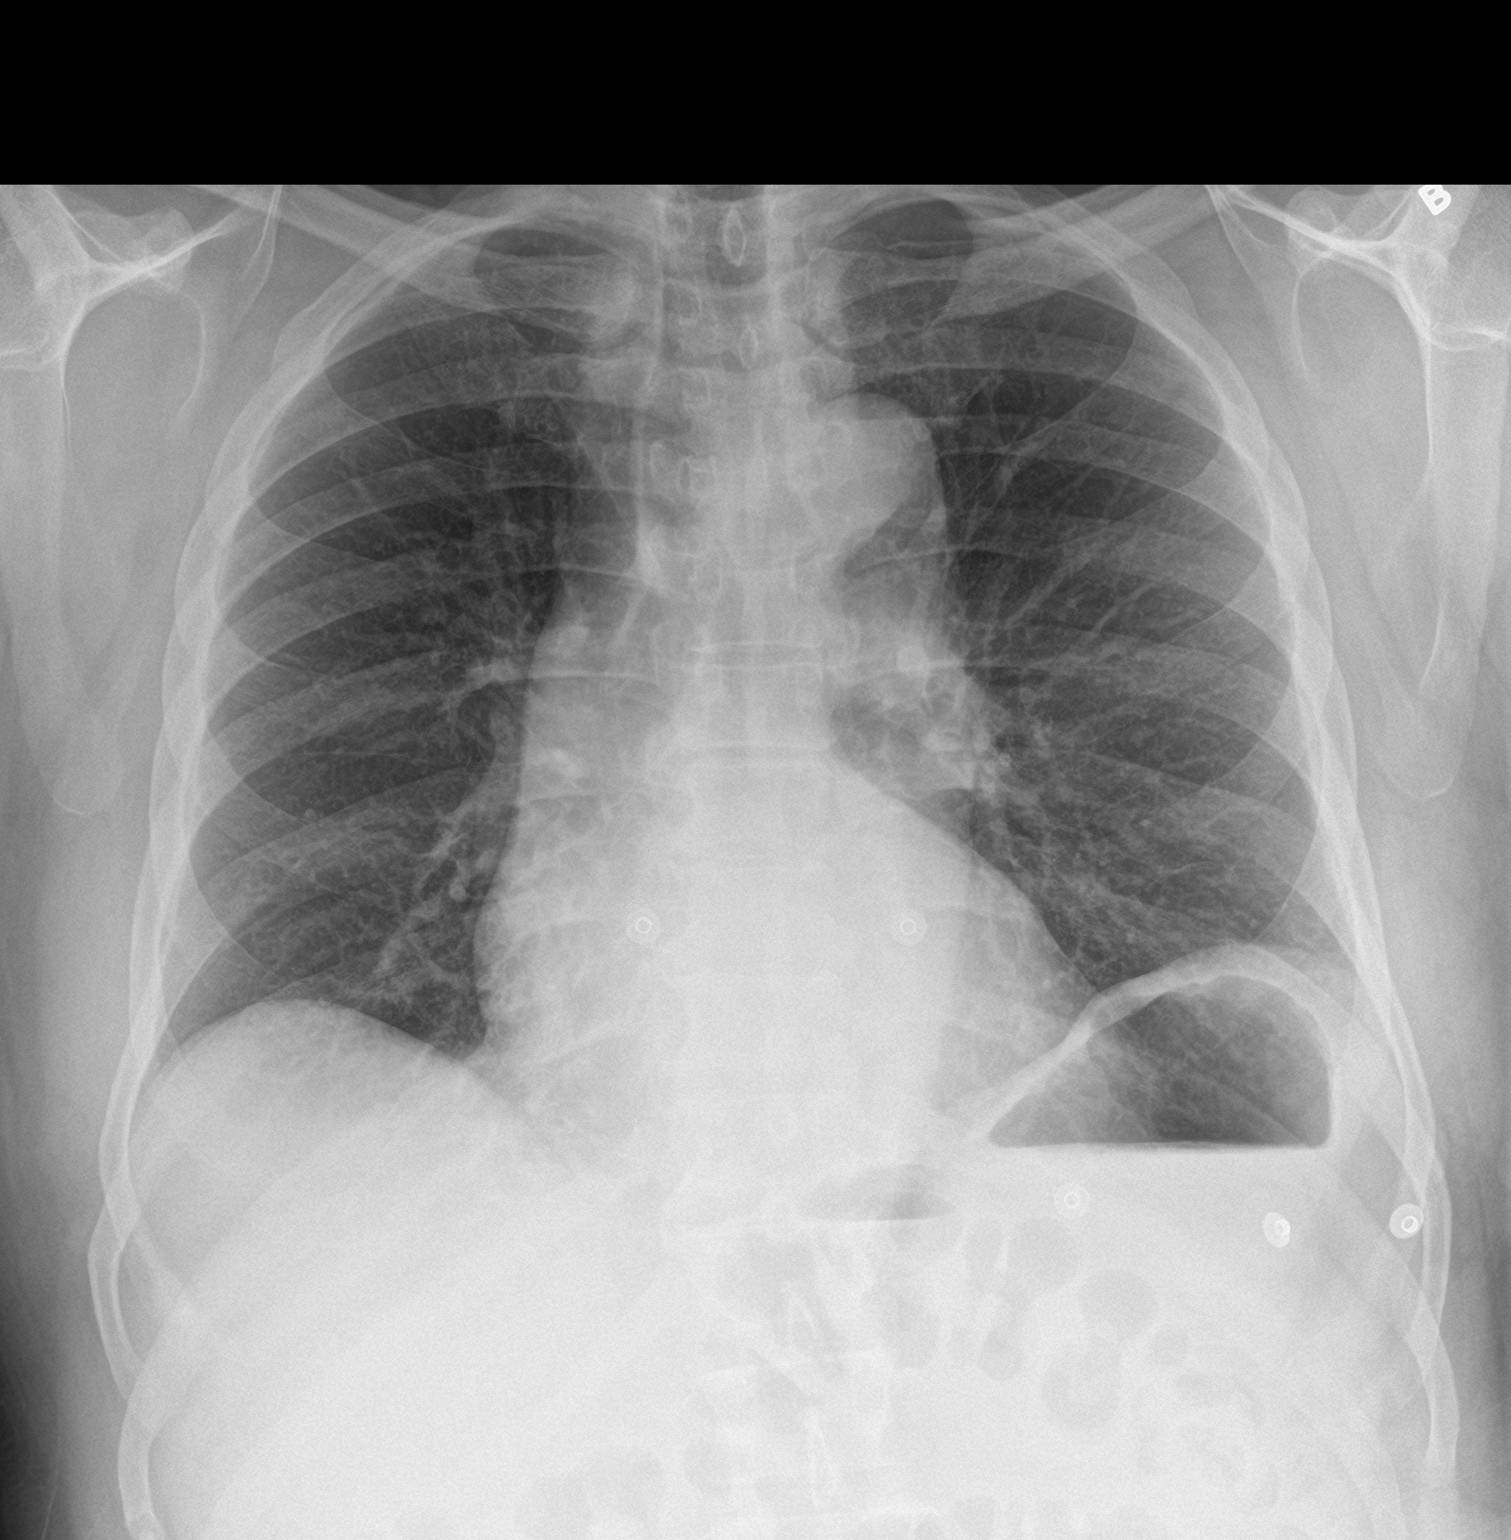

[chest lat]
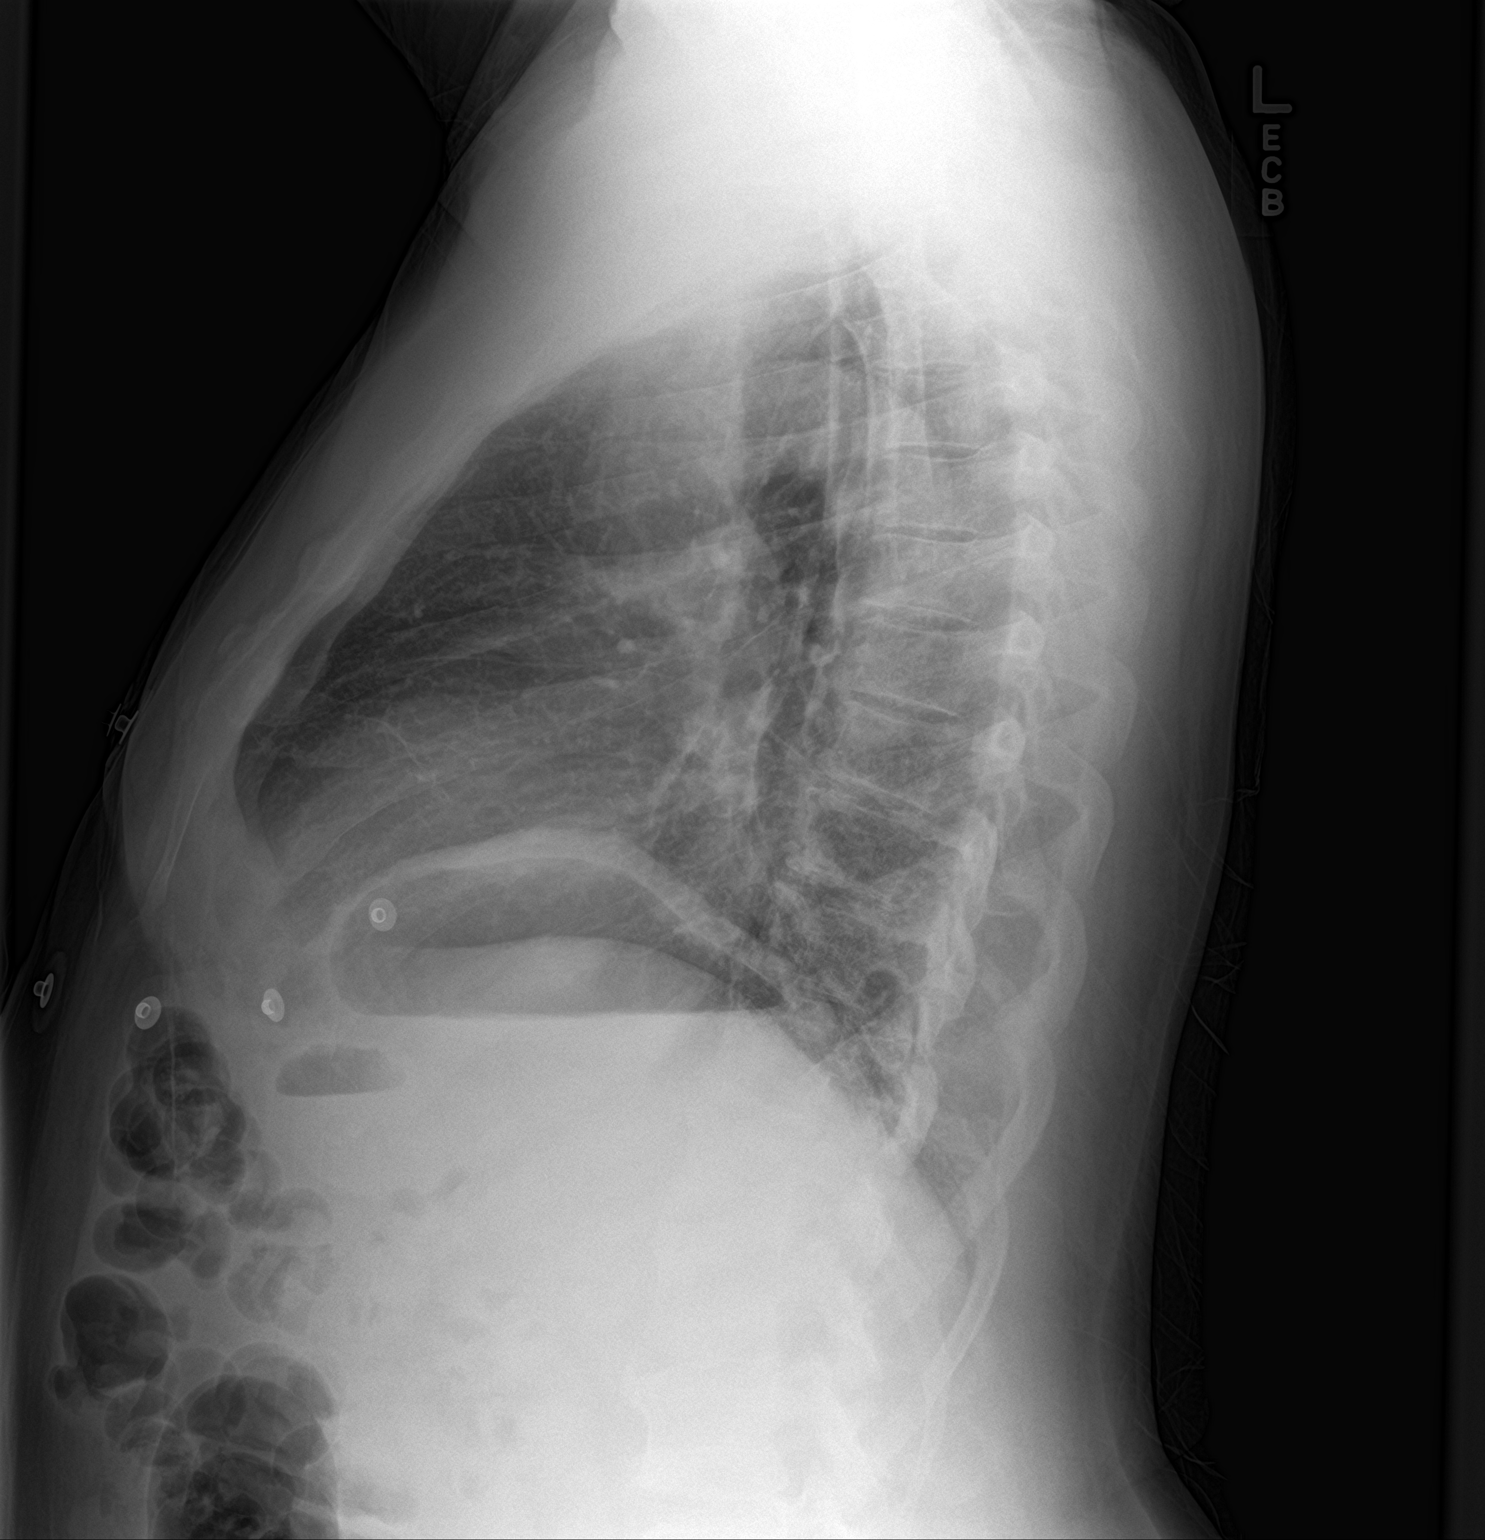

[2 of 2 positions shown; findings below may reference images not displayed]

FINDINGS: Heart is borderline enlarged. The thoracic aorta is uncoiled and
tortuous in appearance without aneurysm. Lungs are free of pneumonic
consolidations, effusions or pneumothoraces. No pulmonary edema.
ACDF of the lower cervical spine. No acute osseous appearing
abnormality.
IMPRESSION: No active cardiopulmonary disease.
